# Patient Record
Sex: Female | Born: 1975
Health system: Southern US, Community
[De-identification: ages and names within clinical notes are randomized; demographics above are authoritative.]

## PROBLEM LIST (undated history)

## (undated) DIAGNOSIS — R0989 Other specified symptoms and signs involving the circulatory and respiratory systems: Secondary | ICD-10-CM

## (undated) DIAGNOSIS — Z8619 Personal history of other infectious and parasitic diseases: Secondary | ICD-10-CM

## (undated) DIAGNOSIS — R011 Cardiac murmur, unspecified: Secondary | ICD-10-CM

## (undated) DIAGNOSIS — IMO0001 Reserved for inherently not codable concepts without codable children: Secondary | ICD-10-CM

## (undated) DIAGNOSIS — D239 Other benign neoplasm of skin, unspecified: Secondary | ICD-10-CM

## (undated) DIAGNOSIS — Q251 Coarctation of aorta: Secondary | ICD-10-CM

## (undated) DIAGNOSIS — E785 Hyperlipidemia, unspecified: Secondary | ICD-10-CM

## (undated) HISTORY — DX: Coarctation of aorta: Q25.1

## (undated) HISTORY — DX: Hyperlipidemia, unspecified: E78.5

## (undated) HISTORY — DX: Cardiac murmur, unspecified: R01.1

## (undated) HISTORY — DX: Personal history of other infectious and parasitic diseases: Z86.19

## (undated) HISTORY — DX: Other benign neoplasm of skin, unspecified: D23.9

## (undated) HISTORY — DX: Reserved for inherently not codable concepts without codable children: IMO0001

## (undated) HISTORY — DX: Other specified symptoms and signs involving the circulatory and respiratory systems: R09.89

---
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
## 1997-03-07 HISTORY — PX: BREAST SURGERY: SHX581

## 1999-04-12 ENCOUNTER — Ambulatory Visit (HOSPITAL_BASED_OUTPATIENT_CLINIC_OR_DEPARTMENT_OTHER): Admission: RE | Admit: 1999-04-12 | Discharge: 1999-04-12 | Payer: Self-pay | Admitting: General Surgery

## 1999-04-12 ENCOUNTER — Encounter (INDEPENDENT_AMBULATORY_CARE_PROVIDER_SITE_OTHER): Payer: Self-pay | Admitting: Specialist

## 2000-06-27 ENCOUNTER — Other Ambulatory Visit: Admission: RE | Admit: 2000-06-27 | Discharge: 2000-06-27 | Payer: Self-pay | Admitting: Family Medicine

## 2001-07-05 ENCOUNTER — Other Ambulatory Visit: Admission: RE | Admit: 2001-07-05 | Discharge: 2001-07-05 | Payer: Self-pay | Admitting: Family Medicine

## 2001-07-10 ENCOUNTER — Encounter: Payer: Self-pay | Admitting: Internal Medicine

## 2002-08-19 ENCOUNTER — Other Ambulatory Visit: Admission: RE | Admit: 2002-08-19 | Discharge: 2002-08-19 | Payer: Self-pay | Admitting: Family Medicine

## 2003-08-22 ENCOUNTER — Other Ambulatory Visit: Admission: RE | Admit: 2003-08-22 | Discharge: 2003-08-22 | Payer: Self-pay | Admitting: Family Medicine

## 2004-01-15 ENCOUNTER — Ambulatory Visit: Payer: Self-pay | Admitting: Family Medicine

## 2004-03-07 HISTORY — PX: DILATION AND CURETTAGE OF UTERUS: SHX78

## 2004-08-18 ENCOUNTER — Other Ambulatory Visit: Admission: RE | Admit: 2004-08-18 | Discharge: 2004-08-18 | Payer: Self-pay | Admitting: Obstetrics and Gynecology

## 2004-08-23 ENCOUNTER — Ambulatory Visit: Payer: Self-pay | Admitting: Internal Medicine

## 2004-11-09 ENCOUNTER — Ambulatory Visit (HOSPITAL_COMMUNITY): Admission: RE | Admit: 2004-11-09 | Discharge: 2004-11-09 | Payer: Self-pay | Admitting: Obstetrics and Gynecology

## 2004-11-09 ENCOUNTER — Encounter (INDEPENDENT_AMBULATORY_CARE_PROVIDER_SITE_OTHER): Payer: Self-pay | Admitting: *Deleted

## 2004-12-09 ENCOUNTER — Ambulatory Visit: Payer: Self-pay | Admitting: Family Medicine

## 2005-01-31 ENCOUNTER — Ambulatory Visit: Payer: Self-pay | Admitting: Internal Medicine

## 2005-01-31 ENCOUNTER — Encounter: Admission: RE | Admit: 2005-01-31 | Discharge: 2005-01-31 | Payer: Self-pay | Admitting: Internal Medicine

## 2005-03-07 HISTORY — PX: COLON SURGERY: SHX602

## 2005-09-16 ENCOUNTER — Encounter: Payer: Self-pay | Admitting: Internal Medicine

## 2005-09-16 ENCOUNTER — Ambulatory Visit: Payer: Self-pay | Admitting: Internal Medicine

## 2005-09-16 ENCOUNTER — Ambulatory Visit (HOSPITAL_COMMUNITY): Admission: RE | Admit: 2005-09-16 | Discharge: 2005-09-16 | Payer: Self-pay | Admitting: Obstetrics and Gynecology

## 2005-10-14 ENCOUNTER — Encounter (INDEPENDENT_AMBULATORY_CARE_PROVIDER_SITE_OTHER): Payer: Self-pay | Admitting: Specialist

## 2005-10-14 ENCOUNTER — Inpatient Hospital Stay (HOSPITAL_COMMUNITY): Admission: EM | Admit: 2005-10-14 | Discharge: 2005-10-19 | Payer: Self-pay | Admitting: Surgery

## 2007-02-01 ENCOUNTER — Inpatient Hospital Stay (HOSPITAL_COMMUNITY): Admission: AD | Admit: 2007-02-01 | Discharge: 2007-02-04 | Payer: Self-pay | Admitting: Obstetrics & Gynecology

## 2008-01-03 ENCOUNTER — Ambulatory Visit: Payer: Self-pay | Admitting: Family Medicine

## 2008-02-05 ENCOUNTER — Ambulatory Visit: Payer: Self-pay | Admitting: Internal Medicine

## 2008-02-05 DIAGNOSIS — Q251 Coarctation of aorta: Secondary | ICD-10-CM

## 2008-02-05 DIAGNOSIS — R011 Cardiac murmur, unspecified: Secondary | ICD-10-CM

## 2008-02-05 DIAGNOSIS — J309 Allergic rhinitis, unspecified: Secondary | ICD-10-CM | POA: Insufficient documentation

## 2008-02-05 DIAGNOSIS — R030 Elevated blood-pressure reading, without diagnosis of hypertension: Secondary | ICD-10-CM

## 2008-02-05 LAB — CONVERTED CEMR LAB
Bilirubin Urine: NEGATIVE
Blood in Urine, dipstick: NEGATIVE
Glucose, Urine, Semiquant: NEGATIVE
Ketones, urine, test strip: NEGATIVE
Nitrite: NEGATIVE
Protein, U semiquant: NEGATIVE
Specific Gravity, Urine: <1.005
Urobilinogen, UA: 0.2
pH: 5

## 2008-02-06 ENCOUNTER — Encounter (INDEPENDENT_AMBULATORY_CARE_PROVIDER_SITE_OTHER): Payer: Self-pay | Admitting: *Deleted

## 2008-02-06 ENCOUNTER — Encounter: Payer: Self-pay | Admitting: Internal Medicine

## 2008-02-06 LAB — CONVERTED CEMR LAB: RBC / HPF: NONE SEEN (ref <3)

## 2008-02-08 LAB — CONVERTED CEMR LAB
ALT: 21 units/L (ref 0–35)
AST: 22 units/L (ref 0–37)
Albumin: 4 g/dL (ref 3.5–5.2)
Alkaline Phosphatase: 35 units/L — ABNORMAL LOW (ref 39–117)
BUN: 7 mg/dL (ref 6–23)
Basophils Absolute: 0 10*3/uL (ref 0.0–0.1)
Basophils Relative: 0.3 % (ref 0.0–3.0)
Bilirubin, Direct: 0.1 mg/dL (ref 0.0–0.3)
CO2: 27 meq/L (ref 19–32)
Calcium: 9.2 mg/dL (ref 8.4–10.5)
Chloride: 105 meq/L (ref 96–112)
Cholesterol: 178 mg/dL (ref 0–200)
Creatinine, Ser: 0.9 mg/dL (ref 0.4–1.2)
Eosinophils Absolute: 0.3 10*3/uL (ref 0.0–0.7)
Eosinophils Relative: 4.7 % (ref 0.0–5.0)
GFR calc Af Amer: 92 mL/min
GFR calc non Af Amer: 76 mL/min
Glucose, Bld: 80 mg/dL (ref 70–99)
HCT: 39.8 % (ref 36.0–46.0)
HDL: 64.2 mg/dL (ref >39.0)
Hemoglobin: 13.7 g/dL (ref 12.0–15.0)
LDL Cholesterol: 98 mg/dL (ref 0–99)
Lymphocytes Relative: 26.4 % (ref 12.0–46.0)
MCHC: 34.4 g/dL (ref 30.0–36.0)
MCV: 84.9 fL (ref 78.0–100.0)
Monocytes Absolute: 0.3 10*3/uL (ref 0.1–1.0)
Monocytes Relative: 5 % (ref 3.0–12.0)
Neutro Abs: 3.9 10*3/uL (ref 1.4–7.7)
Neutrophils Relative %: 63.6 % (ref 43.0–77.0)
Platelets: 211 10*3/uL (ref 150–400)
Potassium: 4.4 meq/L (ref 3.5–5.1)
RBC: 4.69 M/uL (ref 3.87–5.11)
RDW: 13.3 % (ref 11.5–14.6)
Sodium: 138 meq/L (ref 135–145)
TSH: 2 microintl units/mL (ref 0.35–5.50)
Total Bilirubin: 0.8 mg/dL (ref 0.3–1.2)
Total CHOL/HDL Ratio: 2.8
Total Protein: 7 g/dL (ref 6.0–8.3)
Triglycerides: 78 mg/dL (ref 0–149)
VLDL: 16 mg/dL (ref 0–40)
WBC: 6.1 10*3/uL (ref 4.5–10.5)

## 2008-02-15 ENCOUNTER — Ambulatory Visit: Payer: Self-pay

## 2008-02-15 ENCOUNTER — Encounter: Payer: Self-pay | Admitting: Internal Medicine

## 2008-02-22 ENCOUNTER — Telehealth (INDEPENDENT_AMBULATORY_CARE_PROVIDER_SITE_OTHER): Payer: Self-pay | Admitting: *Deleted

## 2008-06-19 ENCOUNTER — Ambulatory Visit: Payer: Self-pay | Admitting: Internal Medicine

## 2008-08-25 ENCOUNTER — Ambulatory Visit: Payer: Self-pay | Admitting: Internal Medicine

## 2008-08-25 LAB — CONVERTED CEMR LAB: Rapid Strep: NEGATIVE

## 2008-09-30 ENCOUNTER — Telehealth: Payer: Self-pay | Admitting: Internal Medicine

## 2009-09-25 ENCOUNTER — Ambulatory Visit: Payer: Self-pay | Admitting: Family Medicine

## 2009-09-28 ENCOUNTER — Ambulatory Visit: Payer: Self-pay | Admitting: Family Medicine

## 2009-12-07 ENCOUNTER — Inpatient Hospital Stay (HOSPITAL_COMMUNITY): Admission: RE | Admit: 2009-12-07 | Discharge: 2009-12-09 | Payer: Self-pay | Admitting: Obstetrics and Gynecology

## 2009-12-09 ENCOUNTER — Encounter
Admission: RE | Admit: 2009-12-09 | Discharge: 2010-01-08 | Payer: Self-pay | Source: Home / Self Care | Admitting: Obstetrics and Gynecology

## 2010-01-05 ENCOUNTER — Encounter: Payer: Self-pay | Admitting: Internal Medicine

## 2010-01-05 LAB — CONVERTED CEMR LAB: Pap Smear: NORMAL

## 2010-01-09 ENCOUNTER — Encounter
Admission: RE | Admit: 2010-01-09 | Discharge: 2010-02-08 | Payer: Self-pay | Source: Home / Self Care | Admitting: Obstetrics and Gynecology

## 2010-02-09 ENCOUNTER — Encounter
Admission: RE | Admit: 2010-02-09 | Discharge: 2010-03-04 | Payer: Self-pay | Source: Home / Self Care | Attending: Obstetrics and Gynecology | Admitting: Obstetrics and Gynecology

## 2010-03-28 ENCOUNTER — Encounter: Payer: Self-pay | Admitting: Obstetrics and Gynecology

## 2010-04-06 NOTE — Assessment & Plan Note (Signed)
Summary: insect bite?/cbs   Vital Signs:  Patient profile:   35 year old female Height:      64.5 inches Weight:      182 pounds BMI:     30.87 Temp:     98.4 degrees F oral Pulse rate:   72 / minute BP sitting:   110 / 70  (left arm)  Vitals Entered By: Jeremy Johann CMA (September 25, 2009 10:52 AM) CC: insect bite on upper left back, redness and pain x1day   History of Present Illness: Pt here c/o rash back.  ? insect bite.  Rash hurts more than it itches.   RAsh x2 days .  Current Medications (verified): 1)  Valtrex 1 Gm Tabs (Valacyclovir Hcl) .Marland Kitchen.. 1 By Mouth Three Times A Day  Allergies (verified): No Known Drug Allergies  Past History:  Past medical, surgical, family and social histories (including risk factors) reviewed for relevance to current acute and chronic problems.  Past Medical History: Reviewed history from 02/05/2008 and no changes required. G3 P1 (1 life birth) Allergic rhinitis has seen ENT and allergy before  Heart murmur  ECHO 2003 and  2007 (reports scanned) aorta coarctation, s/p correction  1983  Past Surgical History: Reviewed history from 02/05/2008 and no changes required. aorta coarctation, s/p correction  1983 breast bx 1999 D&C 2006 D&C w/ colon repair 2007 Csection 2008  Family History: Reviewed history from 02/05/2008 and no changes required. HTN - M DM - no CAD - no stroke - no hyperlipidemia - M colon Ca - no breast Ca - no ovarian/uterine Ca - no melanoma - F (w/ late liver mets)  Social History: Reviewed history from 01/03/2008 and no changes required. Married, 1 child (2008)  Review of Systems      See HPI  Physical Exam  General:  Well-developed,well-nourished,in no acute distress; alert,appropriate and cooperative throughout examination Skin:  + vesicles in clusters R side x2  Psych:  Oriented X3 and normally interactive.     Impression & Recommendations:  Problem # 1:  SHINGLES (ICD-053.9) valtrex for 10  days notify OB call if you need any pain meds  Complete Medication List: 1)  Valtrex 1 Gm Tabs (Valacyclovir hcl) .Marland Kitchen.. 1 by mouth three times a day Prescriptions: VALTREX 1 GM TABS (VALACYCLOVIR HCL) 1 by mouth three times a day  #30 x 0   Entered and Authorized by:   Loreen Freud DO   Signed by:   Loreen Freud DO on 09/25/2009   Method used:   Electronically to        [id number] (retail)       [address]       [place], [place]  [address]       Ph: [phone] or [phone]       Fax: [phone]   RxID:   [id number]

## 2010-04-06 NOTE — Assessment & Plan Note (Signed)
Summary: wants blisters to be checked/diagnosed with shingles on frida...   Vital Signs:  Patient profile:   35 year old female Weight:      182 pounds Pulse rate:   105 / minute BP sitting:   150 / 80  (left arm)  Vitals Entered By: Doristine Devoid CMA (September 28, 2009 9:02 AM) CC: dx w/ shingles would like for them to be checked to see if they are healing    History of Present Illness: 35 yo woman here today for shingles recheck.  in tears b/c she's not sure if she's contagious- employer is concerned.  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  Dr Vincente Poli is OB.  BP is elevated today.  Current Medications (verified): 1)  Valtrex 1 Gm Tabs (Valacyclovir Hcl) .Marland Kitchen.. 1 By Mouth Three Times A Day  Allergies (verified): No Known Drug Allergies  Review of Systems      See HPI  Physical Exam  General:  tearful, obviously stressed Skin:  + vesicles in clusters R side x2    Impression & Recommendations:  Problem # 1:  SHINGLES (ICD-053.9) Assessment Unchanged unchanged.  no signs of infxn.  reviewed that as long as lesions are covered that she is not contagious.  given that lesions will flare in times of stress will write pt out of work for 3 days.  this will also give her time to see her OB given that her BP is elevated.  asymptomatic- pt reports it's stress related.  BP declined to 134/80 by end of appt.  still encouraged her to call OB.  Problem # 2:  ELEVATED BLOOD PRESSURE WITHOUT DIAGNOSIS OF HYPERTENSION (ICD-796.2) Assessment: Unchanged see above.  Complete Medication List: 1)  Valtrex 1 Gm Tabs (Valacyclovir hcl) .Marland Kitchen.. 1 by mouth three times a day  Patient Instructions: 1)  Follow up as needed 2)  Please call Dr Vincente Poli and make an appt about your blood pressure 3)  Finish the Valrex 4)  As long as the lesions are covered you are not contagious 5)  Hang in there!!!

## 2010-05-19 ENCOUNTER — Encounter: Payer: Self-pay | Admitting: Internal Medicine

## 2010-05-19 ENCOUNTER — Other Ambulatory Visit: Payer: Self-pay | Admitting: Internal Medicine

## 2010-05-19 ENCOUNTER — Encounter (INDEPENDENT_AMBULATORY_CARE_PROVIDER_SITE_OTHER): Payer: 59 | Admitting: Internal Medicine

## 2010-05-19 DIAGNOSIS — Z0000 Encounter for general adult medical examination without abnormal findings: Secondary | ICD-10-CM

## 2010-05-19 DIAGNOSIS — E785 Hyperlipidemia, unspecified: Secondary | ICD-10-CM

## 2010-05-19 LAB — BASIC METABOLIC PANEL
BUN: 14 mg/dL (ref 6–23)
CO2: 27 mEq/L (ref 19–32)
Calcium: 9.4 mg/dL (ref 8.4–10.5)
Chloride: 102 mEq/L (ref 96–112)
Creatinine, Ser: 0.8 mg/dL (ref 0.4–1.2)
GFR: 89.98 mL/min (ref >60.00)
Glucose, Bld: 71 mg/dL (ref 70–99)
Potassium: 4.3 mEq/L (ref 3.5–5.1)
Sodium: 138 mEq/L (ref 135–145)

## 2010-05-19 LAB — LIPID PANEL
Cholesterol: 215 mg/dL — ABNORMAL HIGH (ref 0–200)
HDL: 55.5 mg/dL (ref >39.00)
Total CHOL/HDL Ratio: 4
Triglycerides: 55 mg/dL (ref 0.0–149.0)
VLDL: 11 mg/dL (ref 0.0–40.0)

## 2010-05-19 LAB — CBC WITH DIFFERENTIAL/PLATELET
Basophils Absolute: 0 10*3/uL (ref 0.0–0.1)
Basophils Relative: 0.7 % (ref 0.0–3.0)
Eosinophils Absolute: 0.2 10*3/uL (ref 0.0–0.7)
Eosinophils Relative: 3.7 % (ref 0.0–5.0)
HCT: 43.1 % (ref 36.0–46.0)
Hemoglobin: 14.6 g/dL (ref 12.0–15.0)
Lymphocytes Relative: 29.5 % (ref 12.0–46.0)
Lymphs Abs: 1.7 10*3/uL (ref 0.7–4.0)
MCHC: 33.8 g/dL (ref 30.0–36.0)
MCV: 82.6 fl (ref 78.0–100.0)
Monocytes Absolute: 0.3 10*3/uL (ref 0.1–1.0)
Monocytes Relative: 4.7 % (ref 3.0–12.0)
Neutro Abs: 3.4 10*3/uL (ref 1.4–7.7)
Neutrophils Relative %: 61.4 % (ref 43.0–77.0)
Platelets: 294 10*3/uL (ref 150.0–400.0)
RBC: 5.22 Mil/uL — ABNORMAL HIGH (ref 3.87–5.11)
RDW: 14.7 % — ABNORMAL HIGH (ref 11.5–14.6)
WBC: 5.6 10*3/uL (ref 4.5–10.5)

## 2010-05-19 LAB — CBC
HCT: 27 % — ABNORMAL LOW (ref 36.0–46.0)
Hemoglobin: 9.1 g/dL — ABNORMAL LOW (ref 12.0–15.0)
MCH: 27 pg (ref 26.0–34.0)
MCHC: 33.8 g/dL (ref 30.0–36.0)
MCV: 79.8 fL (ref 78.0–100.0)
Platelets: 218 10*3/uL (ref 150–400)
RBC: 3.39 MIL/uL — ABNORMAL LOW (ref 3.87–5.11)
RDW: 14.3 % (ref 11.5–15.5)
WBC: 9.4 10*3/uL (ref 4.0–10.5)

## 2010-05-19 LAB — TSH: TSH: 1.81 u[IU]/mL (ref 0.35–5.50)

## 2010-05-19 LAB — LDL CHOLESTEROL, DIRECT: Direct LDL: 139.8 mg/dL

## 2010-05-19 LAB — AST: AST: 18 U/L (ref 0–37)

## 2010-05-19 LAB — ALT: ALT: 18 U/L (ref 0–35)

## 2010-05-20 LAB — CBC
HCT: 32.6 % — ABNORMAL LOW (ref 36.0–46.0)
Hemoglobin: 11.1 g/dL — ABNORMAL LOW (ref 12.0–15.0)
MCH: 27.3 pg (ref 26.0–34.0)
MCHC: 34 g/dL (ref 30.0–36.0)
MCV: 80.4 fL (ref 78.0–100.0)
Platelets: 276 10*3/uL (ref 150–400)
RBC: 4.05 MIL/uL (ref 3.87–5.11)
RDW: 14.2 % (ref 11.5–15.5)
WBC: 8.1 10*3/uL (ref 4.0–10.5)

## 2010-05-20 LAB — SURGICAL PCR SCREEN
MRSA, PCR: NEGATIVE
Staphylococcus aureus: NEGATIVE

## 2010-05-20 LAB — RPR: RPR Ser Ql: NONREACTIVE

## 2010-05-25 NOTE — Assessment & Plan Note (Signed)
Summary: physical--[id number]//ph   Vital Signs:  Patient profile:   35 year old female Height:      64.5 inches Weight:      136.13 pounds BMI:     23.09 Pulse rate:   61 / minute Pulse rhythm:   regular BP sitting:   116 / 74  (left arm) Cuff size:   regular  Vitals Entered By: Army Fossa CMA (May 19, 2010 8:23 AM) CC: CPX, fasting Comments no complaints    History of Present Illness: CPX had a baby 5 months ago, not brest feeding  ambulatory BPs WNL , occasionally feels anxious and wonders how BP is   Current Medications (verified): 1)  None  Allergies (verified): No Known Drug Allergies  Past History:  Past Medical History: G2 P2   Allergic rhinitis has seen ENT and allergy before  Heart murmur  ECHO 2003 and  2007 (reports scanned) aorta coarctation, s/p correction  1983 palpable Ao (abdmen) normal Ao, Renal U/S 12-09 mild shingels during pregnancy 2011  Past Surgical History: aorta coarctation, s/p correction  1983 breast bx 1999 D&C 2006 D&C w/ colon repair 2007 Csection 2008 Csection 12-2009  Family History: HTN - M hypothyroid-- M DM - no CAD - no stroke - no hyperlipidemia - M colon Ca - no breast Ca - no ovarian/uterine Ca - no melanoma - F (w/ late liver mets)  Social History: BC-- husband vasectomy  Married, 2  child (2008, 2011) tobacco--no ETOH-- socially  FT job, 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 CV:  Denies chest pain or discomfort and swelling of feet. Resp:  Denies cough and shortness of breath. GI:  Denies bloody stools, nausea, and vomiting. Psych:  Denies anxiety and depression.  Physical Exam  General:  alert, well-developed, and well-nourished.   Neck:   no thyromegaly Lungs:  normal respiratory effort, no intercostal retractions, no accessory muscle use, and normal breath sounds.   Heart:  (+) syst. murmur III/VI, increased  S2? normal rate  Abdomen:  soft, non-tender, no distention, no guarding, and no rigidity.   palpable aorta, nontender , at the upper abdomen Extremities:   no lower extremity edema Psych:  Cognition and judgment appear intact. Alert and cooperative with normal attention span and concentration.      Impression & Recommendations:  Problem # 1:  HEALTH MAINTENANCE EXAM (ICD-V70.0) Td-03  sees gyn never had a Cscope   encouraged to continue with her her  healthy lifestyle Orders: Venipuncture (16109) TLB-BMP (Basic Metabolic Panel-BMET) (80048-METABOL) TLB-CBC Platelet - w/Differential (85025-CBCD) TLB-Lipid Panel (80061-LIPID) TLB-TSH (Thyroid Stimulating Hormone) (84443-TSH) TLB-ALT (SGPT) (84460-ALT) TLB-AST (SGOT) (84450-SGOT) Specimen Handling (60454)  Problem # 2:  SYSTOLIC MURMUR (UJW-119.2)   history of systolic murmur, history of Ao coarctation    would like to get established with a cardiologist. Will refer  Orders: Cardiology Referral (Cardiology)  Problem # 3:  ELEVATED BLOOD PRESSURE WITHOUT DIAGNOSIS OF HYPERTENSION (ICD-796.2)  good ambulatory BPs  encouraged to get her own cuff  to keep a closer eye on her BP  BP today: 116/74 Prior BP: 150/80 (09/28/2009)  Labs Reviewed: Creat: 0.9 (02/05/2008) Chol: 178 (02/05/2008)   HDL: 64.2 (02/05/2008)   LDL: 98 (02/05/2008)   TG: 78 (02/05/2008)     Patient Instructions: 1)  Please schedule a follow-up appointment in 1 year.    Orders Added: 1)  Venipuncture [36415] 2)  TLB-BMP (Basic Metabolic Panel-BMET) [  80048-METABOL] 3)  TLB-CBC Platelet - w/Differential [85025-CBCD] 4)  TLB-Lipid Panel [80061-LIPID] 5)  TLB-TSH (Thyroid Stimulating Hormone) [84443-TSH] 6)  TLB-ALT (SGPT) [84460-ALT] 7)  TLB-AST (SGOT) [84450-SGOT] 8)  Specimen Handling [99000] 9)  Est. Patient age 39-39 [phone] 6)  Cardiology Referral [Cardiology]     Preventive Care Screening  Pap Smear:    Date:  01/05/2010    Results:   normal

## 2010-06-25 ENCOUNTER — Encounter: Payer: Self-pay | Admitting: Cardiology

## 2010-06-25 ENCOUNTER — Encounter: Payer: Self-pay | Admitting: *Deleted

## 2010-06-28 ENCOUNTER — Encounter: Payer: Self-pay | Admitting: Cardiology

## 2010-06-28 ENCOUNTER — Ambulatory Visit (INDEPENDENT_AMBULATORY_CARE_PROVIDER_SITE_OTHER): Payer: 59 | Admitting: Cardiology

## 2010-06-28 DIAGNOSIS — R011 Cardiac murmur, unspecified: Secondary | ICD-10-CM

## 2010-06-28 DIAGNOSIS — R030 Elevated blood-pressure reading, without diagnosis of hypertension: Secondary | ICD-10-CM

## 2010-06-28 DIAGNOSIS — Q251 Coarctation of aorta: Secondary | ICD-10-CM

## 2010-06-28 NOTE — Assessment & Plan Note (Signed)
Echocardiogram to exclude bicuspid valve.

## 2010-06-28 NOTE — Progress Notes (Signed)
HPI: 35 year old female who evaluation of coarctation of the aorta. Abdominal ultrasound in December of 2009 showed a normal abdominal aorta, normal renal arteries. Echocardiogram in 2007 showed normal LV function and peak velocity in descending aorta of 2.3 m/s. Patient is status post repair of coarctation in 36. She denies dyspnea on exertion, orthopnea, PND, pedal edema, palpitations, syncope or chest pain.  No current outpatient prescriptions on file.    No Known Allergies  Past Medical History  Diagnosis Date  . Allergic rhinitis   . History of shingles     mild during pregancy 2011  . Aorta coarctation     s/p correction 1983  . Hyperlipidemia     Past Surgical History  Procedure Date  . Breast surgery     Benign nodule removed  . Colon surgery   . Cesarean section   . Coarctation of aorta repair     History   Social History  . Marital Status: Married    Spouse Name: N/A    Number of Children: 2  . Years of Education: N/A   Occupational History  . [employer]    Social History Main Topics  . Smoking status: Never Smoker   . Smokeless tobacco: Not on file  . Alcohol Use: Yes     Rare  . Drug Use: No  . Sexually Active: Not on file   Other Topics Concern  . Not on file   Social History Narrative  . No narrative on file    Family History  Problem Relation Age of Onset  . Hypertension    . Hyperlipidemia      ROS: no fevers or chills, productive cough, hemoptysis, dysphasia, odynophagia, melena, hematochezia, dysuria, hematuria, rash, seizure activity, orthopnea, PND, pedal edema, claudication. Remaining systems are negative.  Physical Exam: Right arm 142/80. Left arm 132/74. General:  Well developed/well nourished in NAD Skin warm/dry Patient not depressed No peripheral clubbing Back-normal HEENT-normal/normal eyelids Neck supple/normal carotid upstroke bilaterally; no bruits; no JVD; no thyromegaly chest - CTA/ normal expansion CV -  RRR/normal S1 and S2; no rubs or gallops;  PMI nondisplaced; 2/6 systolic murmur upper left sternal border. Abdomen -NT/ND, no HSM, no mass, + bowel sounds, no bruit 2+ femoral pulses, no bruits Ext-no edema, chords, 2+ DP Neuro-grossly nonfocal  ECG Sinus rhythm at a rate of 67. Cannot rule out prior septal infarct. RV conduction delay.

## 2010-06-28 NOTE — Assessment & Plan Note (Signed)
Patient is status post repair of coarctation. I will schedule a CTA of her thoracic aorta to assess. Schedule echocardiogram to exclude bicuspid aortic valve.

## 2010-06-28 NOTE — Patient Instructions (Signed)
Your physician has requested that you have an echocardiogram. Echocardiography is a painless test that uses sound waves to create images of your heart. It provides your doctor with information about the size and shape of your heart and how well your heart's chambers and valves are working. This procedure takes approximately one hour. There are no restrictions for this procedure.   Non-Cardiac CT Angiography (CTA), is a special type of CT scan that uses a computer to produce multi-dimensional views of major blood vessels throughout the body. In CT angiography, a contrast material is injected through an IV to help visualize the blood vessels   Your physician recommends that you schedule a follow-up appointment in: ONE YEAR

## 2010-06-28 NOTE — Assessment & Plan Note (Signed)
Watch blood pressure and treat as needed.

## 2010-07-14 ENCOUNTER — Ambulatory Visit (HOSPITAL_COMMUNITY): Payer: 59 | Attending: Cardiology | Admitting: Radiology

## 2010-07-14 ENCOUNTER — Ambulatory Visit (INDEPENDENT_AMBULATORY_CARE_PROVIDER_SITE_OTHER)
Admission: RE | Admit: 2010-07-14 | Discharge: 2010-07-14 | Disposition: A | Discharge status: Home / Self Care | Payer: 59 | Source: Ambulatory Visit | Attending: Cardiology | Admitting: Cardiology

## 2010-07-14 DIAGNOSIS — R011 Cardiac murmur, unspecified: Secondary | ICD-10-CM | POA: Insufficient documentation

## 2010-07-14 DIAGNOSIS — Q251 Coarctation of aorta: Secondary | ICD-10-CM

## 2010-07-14 MED ORDER — IOHEXOL 300 MG/ML  SOLN
100.0000 mL | Freq: Once | INTRAMUSCULAR | Status: AC | PRN
  Administered 2010-07-14: 100 mL via INTRAVENOUS

## 2010-07-20 NOTE — Discharge Summary (Signed)
NAME:  [name], [name]                   ACCOUNT NO.:  [id number]   MEDICAL RECORD NO.:  [id number]          PATIENT TYPE:  INP   LOCATION:  9148                          FACILITY:  [facility]   PHYSICIAN:  Duke Salvia. Marcelle Overlie, M.D.DATE OF BIRTH:  [birth year]   DATE OF ADMISSION:  02/01/2007  DATE OF DISCHARGE:  02/04/2007                               DISCHARGE SUMMARY   ADMITTING DIAGNOSES:  1. Intrauterine pregnancy at 45 weeks estimated gestational age.  2. Spontaneous rupture of membranes.  3. History of uterine perforation after DandE for trisomy 18.   DISCHARGE DIAGNOSES:  1. Status post low transverse cesarean section.  2. Viable female infant.   REASON FOR ADMISSION:  Please see written H&P.   HOSPITAL COURSE:  The patient is 35 year old white married female  gravida 2, para 0 that was admitted to [facility] at 36  weeks estimated gestational age with spontaneous rupture of membranes.  The patient's history was significant for history of a uterine  perforation after DNA for trisomy 18.  The uterine perforation had also  resulted in a partial sigmoid colon resection.  The patient had been  scheduled for cesarean delivery.  Due to spontaneous rupture of  membranes, the decision was made to proceed with a primary low  transverse cesarean section.  The patient was then transferred to the  operating room where spinal anesthesia was administered without  difficulty.  A low transverse incision was made with delivery of a  viable female infant weighing 6 pounds 7 ounces with Apgars of 9 at 1  minute and 9 at 5 minutes.  The patient tolerated procedure well and  taken to the recovery room in stable condition.  On postoperative day  #1, the patient was without complaint.  Vital signs were stable.  She  was afebrile.  Abdomen was soft with good return of bowel function.  Fundus is firm and nontender.  Incision was clean, dry and intact.  On  postoperative day #2 the patient was  without complaint.  Vital signs  remained stable.  She was afebrile.  She was ambulating well.  Incision  continued to be clean, dry and intact.  On postoperative day #3 the  patient was without complaint.  Vital signs were stable.  She was  afebrile.  Fundus firm and nontender.  Incision was clean, dry and  intact.  Staples removed and the patient was later discharged home.   CONDITION ON DISCHARGE:  Stable.   DISCHARGE INSTRUCTIONS:  1. Diet regular as tolerated.  2. Activity no heavy lifting, no driving x2 weeks, no vaginal entry.   FOLLOW UP:  Patient follow up in the office in 1-2 weeks for incision  check.  She is to call for temperature greater than 100 degrees,  persistent nausea, vomiting, heavy vaginal bleeding and/or redness or  drainage from incisional site.   DISCHARGE MEDICATIONS:  1. Tylox #30 one p.o. q.4-6 p.r.n.  2. Motrin 600 mg q.6 h.  3. Prenatal vitamins one p.o. daily.  4. Colace one p.o. daily p.r.n.      Okey Regal  Lyda Jester, N.P.      Richard M. Marcelle Overlie, M.D.  Electronically Signed    CC/MEDQ  D:  02/22/2007  T:  02/22/2007  Job:  [id number]

## 2010-07-20 NOTE — Op Note (Signed)
NAME:  [name], [name]                   ACCOUNT NO.:  [id number]   MEDICAL RECORD NO.:  [id number]          PATIENT TYPE:  INP   LOCATION:  9148                          FACILITY:  [facility]   PHYSICIAN:  Michelle L. Grewal, M.D.DATE OF BIRTH:  10-05-1975   DATE OF PROCEDURE:  02/01/2007  DATE OF DISCHARGE:                               OPERATIVE REPORT   PREOPERATIVE DIAGNOSES:  1. Intrauterine pregnancy at 36 weeks.  2. Spontaneous rupture of membranes.  3. Labor.  4. History of uterine perforation after dilatation and evacuation for      Trisomy 18   POSTOPERATIVE DIAGNOSES:  1. Intrauterine pregnancy at 36 weeks.  2. Spontaneous rupture of membranes.  3. Labor.  4. History of uterine perforation after dilatation and evacuation for      Trisomy 18   PROCEDURE:  Primary low transverse cesarean section.   SURGEON:  Michelle L. Vincente Poli, M.D.   ASSISTANTFreddy Finner, M.D.   ANESTHESIA:  Spinal.   SPECIMENS:  Female infant, Apgars 9 at 1 minute and 10 at 5 minutes.   ESTIMATED BLOOD LOSS:  500 mL.   COMPLICATIONS:  None.   DRAINS:  Foley.   DESCRIPTION OF PROCEDURE:  The patient was taken to the operating room.  She was given her spinal without incident after informed consent was  obtained.  She was then prepped and draped in the usual sterile fashion.   A low transverse incision was made in the skin and carried down to the  fascia.  The fascia was scored in the midline and extended laterally.  The rectus muscles were separated in the midline.  The peritoneum was  entered bluntly.  The peritoneal incision was then stretched.  The  bladder blade was inserted.  A low transverse incision was made in the  uterus.  The uterus was entered using a hemostat.  Amniotic fluid was  clear.  The baby was in cephalic presentation and was delivered easily  without any difficulty.  The baby was a female infant with Apgars of 9  at 1 minute and 10 at 5 minutes.  The cord was clamped and  cut.  The  baby was handed to the awaiting pediatricians.  The uterus was  exteriorized of blood.  The placenta was cleared of all clots and  debris.  The uterine incision was closed in one layer using 0 chromic in  a continuous running locked stitch.  There were some small adhesions in  the posterior wall of the uterus involving the sigmoid colon and uterus,  and I did take those adhesions down sharply without any difficulty.  It  was probably from her previous sigmoid colon resection.  The uterus was  returned to the abdomen.  Irrigation was performed.  Hemostasis was  excellent.  The peritoneum and rectus muscles were reapproximated using  0 Vicryl.  The fascia was closed using 0 Vicryl in a running stitch x2  starting at each corner and meeting in the midline.  After irrigation of  the subcutaneous layer, the skin was closed with staples.  All sponge,  lap, and instrument counts were correct x2.   The patient went to the recovery room in stable condition.      Michelle L. Vincente Poli, M.D.  Electronically Signed     MLG/MEDQ  D:  03/30/2007  T:  03/30/2007  Job:  [id number]

## 2010-07-23 NOTE — H&P (Signed)
NAMEAcquanetta, [name]                   ACCOUNT NO.:  [id number]   MEDICAL RECORD NO.:  [id number]          PATIENT TYPE:  INP   LOCATION:  1606                         FACILITY:  [facility]   PHYSICIAN:  Freddy Finner, M.D.   DATE OF BIRTH:  1975-09-12   DATE OF ADMISSION:  10/14/2005  DATE OF DISCHARGE:                                HISTORY & PHYSICAL   ADMITTING DIAGNOSES:  Status post sigmoid colon resection and reanastomosis  following perforation of uterus for second trimester pregnancy termination  with bowel injury.   The patient is a 35 year old white married female, gravida 2, para 0, who  had a diagnosis of trisomy 82 made with antenatal testing.  After careful  consultation with a regular physician, Dr. Vincente Poli, she has elected to  proceed with termination of her pregnancy by dilatation and evacuation of  her uterus.  This procedure was accomplished at the [facility] on the day of this admission, October 14, 2005.  The procedure was  complicated by uterine perforation with injury to sigmoid colon.  She  subsequently underwent a laparoscopy and exploratory laparotomy, during  which time the uterine perforation was repaired, and she had a resection of  approximately 3 inches of sigmoid colon with reanastomosis by Dr. Jamey Ripa.  She is admitted to this hospital for further postoperative management.   REVIEW OF SYSTEMS:  On admission was unremarkable.  No cardiopulmonary, GI  or GU problems unrelated to the surgical procedure which required her  admission.   PAST MEDICAL HISTORY:  Significant for coarctation of the aorta at birth for  which she had surgical correction in 1983.  She has no other known  significant medical illnesses.  She is currently on no medications on a  regular basis other than her prenatal vitamins.  Previous surgical  procedures included D&E for spontaneous abortion in September of 2006, and  her heart surgery; those are the only ones.  She does  not have known  allergies to medications.  She has never had a blood transfusion.  She does  not use cigarettes or alcohol.   FAMILY HISTORY:  Mother is known to have hypertension.  Father died with  liver cancer but had exposure to Agent Orange.  Mother is further known to  have thyroid dysfunction.  She has no other significant family history.   PHYSICAL EXAMINATION:  HEENT:  Grossly within normal limits.  Thyroid gland  is not palpably enlarged.  VITAL SIGNS:  Most recent blood pressure in the office was 124/70.  CHEST:  Clear to auscultation throughout.  BREASTS:  Exam is deferred, but was performed by Dr. Vincente Poli at her  admission.  I revisited it and it was normal.  HEART:  Normal sinus rhythm.  There is a loud grade 2-3 holosystolic murmur  at the second intercostal space on the left chest, consistent with her  previous heart history.  There are no other audible murmurs, rubs, or  gallops.  ABDOMEN:  Remarkable for the gravid state with uterine size consistent with  16 weeks.  PELVIC:  Exam  performed in the office on the day prior to procedure revealed  the uterus to be appropriate for dates.  The cervix was soft, consistent  with a pregnant state but closed.  A thick laminaria was placed at the time  of my examination and left in place until the surgical procedure noted  above.  EXTREMITIES:  Without cyanosis, clubbing, or edema.   ASSESSMENT:  Intrauterine pregnancy, 16 weeks' gestation, trisomy 32 genetic  defect, status post D&E for termination of pregnancy and exploratory  laparotomy for repair of uterine perforation and partial sigmoid resection  with reanastomosis of bowel.   PLAN:  Admission to this hospital for further management and followup by Dr.  Jamey Ripa and his associates, as well as our team following her bowel  resection.      Freddy Finner, M.D.  Electronically Signed     WRN/MEDQ  D:  10/15/2005  T:  10/15/2005  Job:  [id number]

## 2010-07-23 NOTE — Discharge Summary (Signed)
[name], [name]                   ACCOUNT NO.:  [id number]   MEDICAL RECORD NO.:  [id number]          PATIENT TYPE:  INP   LOCATION:  1606                         FACILITY:  [facility]   PHYSICIAN:  Currie Paris, M.D.DATE OF BIRTH:  08-Oct-1975   DATE OF ADMISSION:  10/14/2005  DATE OF DISCHARGE:  10/19/2005                                 DISCHARGE SUMMARY   FINAL DIAGNOSES:  1. 16 weeks intrauterine pregnancy, Trisomy 18 genetic defect.  2. Status post D&E for termination of pregnancy with exploratory      laparotomy for repair of uterine injury and partial sigmoid resection      with anastomosis of colon.  3. Acute blood loss anemia.   HISTORY OF PRESENT ILLNESS/HOSPITAL COURSE:  [name] is a 35 year old  lady who had a diagnosis of Trisomy 23. After consultations, she elected to  have termination of her pregnancy. This was done on October 14, 2005 by Dr.  Varney Baas. At surgery she had a perforation of the uterus with also injury  to the sigmoid colon that occurred at that time and was immediately  recognized. She underwent laparotomy with repair of her uterus by Dr. Jennette Kettle.  She also had resection and primary anastomosis of her sigmoid colon.  Postoperatively, she did well. Her hemoglobin drifted down and she was  transfused 1 unit of blood. She gradually developed bowel sounds, bowel  function and was able to be ambulating and tolerated a diet. By October 19, 2005, she felt able to go home. She had been ambulating, taking care of  herself with minimal pain and normal bowel function. No problems with fevers  and her wound healing nicely. The patient was discharged in satisfactory  condition to followup in my office as well as with Dr. Jennette Kettle, to continue her  Wellbutrin and iron.   LABORATORY DATA:  Studies this admission included an initial hemoglobin  immediately postoperative of 8.9, which drifted to 6.7 but postoperative at  9.5 by discharge. Electrolytes are basically  normal.   No pathology report is available in the chart at this time for dictation  into the summary.      Currie Paris, M.D.  Electronically Signed     CJS/MEDQ  D:  11/14/2005  T:  11/14/2005  Job:  [id number]   cc:   Freddy Finner, M.D.  Fax: [phone]

## 2010-07-23 NOTE — Op Note (Signed)
NAMEShavontae, [name]                   ACCOUNT NO.:  [id number]   MEDICAL RECORD NO.:  [id number]         PATIENT TYPE:  LINP   LOCATION:                               FACILITY:  [facility]   PHYSICIAN:  Currie Paris, M.D.DATE OF BIRTH:  Apr 04, 1975   DATE OF PROCEDURE:  10/14/2005  DATE OF DISCHARGE:                                 OPERATIVE REPORT   DIAGNOSIS:  Rectal sigmoid perforation.   CLINICAL HISTORY:  Mrs. Pickford is a 35 year old lady who was undergoing an  abortive procedure by Dr. Jennette Kettle during which the uterus was apparently  perforated and Dr. Jennette Kettle recognized that there was also a bowel injury.  He  asked [place] to come over largely to evaluate the situation.  On my arrival, the  patient's abdomen was opened.  Dr. Jennette Kettle had repaired the uterus.  There was  a segment of the rectosigmoid that had a deserosalized area but also a  perforation with basically no spill.  There were also some other areas of  deserosalization extending over a distance of about 15 cm.  I did not think  this was particularly reparable because of the length of the injury running  longitudinally and then we could not repair this without some significant  narrowing of the colon.   I, therefore, elected to a resection.  I freed up the proximal sigmoid using  a cautery.  I picked a point in the very distal sigmoid where we had clear  healthy tissue, made a window in the mesentery and divided the colon between  bowel clamps.  The mesocolon was then divided down towards the rectum until  I was beyond the area of injury which was really at the rectosigmoid  junction.  A right-angle clamp was placed across here and the bowel cut off  and the resection completed.   Because the abdomen had very little contamination and there was very little  stool present in the colon, I went ahead and elected to do a primary  anastomosis.  This was done in a single layer with 3-0 silk. I put in the  back row, then turned inthe  front row. I placed some additional Lembert  sutures to be sure we had complete closure.  Lumen was fairly large here so  we had a very good lumen and there was absolutely no tension on the  anastomosis.  The blood supply was quite good.   Once this was done, we changed gloves and instruments and irrigated and made  sure everything was completely dry.  I put Tisseel around the anastomosis to  see if that would also help protect it.  We also irrigated, washed the colon  out a little bit with some Betadine-soaked 4 x 4 to see if that would also  diminish the bacterial countrior to doing the  anastomosis.  I thought that since this was a healthy patient, we were safe  putting this colon back together at this point since we had basically very  healthy colon at each end.   The patient tolerated this procedure nicely.  Dr. Jennette Kettle then closed.      Currie Paris, M.D.  Electronically Signed     CJS/MEDQ  D:  10/14/2005  T:  10/14/2005  Job:  [id number]   cc:   Freddy Finner, M.D.  Fax: [phone]

## 2010-07-23 NOTE — Op Note (Signed)
NAME:  [name], Natasha                   ACCOUNT NO.:  [id number]   MEDICAL RECORD NO.:  [id number]          PATIENT TYPE:  AMB   LOCATION:  SDC                           FACILITY:  [facility]   PHYSICIAN:  Michelle L. Grewal, M.D.DATE OF BIRTH:  02/03/1976   DATE OF PROCEDURE:  11/09/2004  DATE OF DISCHARGE:                                 OPERATIVE REPORT   PREOP DIAGNOSIS:  Missed AB.   POSTOP DIAGNOSIS:  Missed AB.   PROCEDURE:  Dilatation and evacuation.   SURGEON:  Dr. Vincente Poli.   ANESTHESIA:  MAC with local.   SPECIMENS:  Products of conception.   ESTIMATED BLOOD LOSS:  Minimal.   COMPLICATIONS:  None.   PROCEDURE:  Patient taken to the operating room. She was given anesthesia.  She was prepped and draped in usual sterile fashion.  In and out catheter  was used to empty the bladder.  Speculum was inserted the vagina. The cervix  was grasped with tenaculum and a paracervical block was performed in  standard fashion. The uterus was sounded in the midline and cervix was noted  to be slightly open.  The cervical internal os was gently dilated using  Pratt dilators.  A #7 suction cannula was inserted and suction curettage was  performed x2 with retrieval of contents grossly consistent with products of  conception.  The suction curette was removed. A sharp curette was inserted  and the uterus was thoroughly curetted of all tissue.  At the end of the  procedure, all sponge, lap and instrument counts were correct x2. The  patient went to recovery room in stable condition.  Of note, she is Rh  positive.      Michelle L. Vincente Poli, M.D.  Electronically Signed     MLG/MEDQ  D:  11/09/2004  T:  11/09/2004  Job:  [id number]

## 2010-07-23 NOTE — Op Note (Signed)
. [facility]  Patient:    [name]                              MRN: [id number] Proc. Date: 04/12/99 Adm. Date:  [id number] Attending:  Tempie Donning CC:         Angelena Sole, M.D. [facility]                           Operative Report  REFERRING PHYSICIAN:  Angelena Sole, M.D.  OPERATIVE PROCEDURE:  Excision left breast mass.  SURGEON:  Gita Kudo, M.D.  ANESTHESIA:  General - endotracheal.  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  COMMENT:  Pending pathology.  CLINICAL SUMMARY:  This is a 35 year old female with a firm rubbery nodule at 6 oclock in her left breast.  An ultrasound showed that it was a probably benign lesion.  Physical examination led me to think it was a fibroadenoma, but because of its size - about 3.5 cm, and the fact that it was solitary, made [place] consider excisional biopsy.  The patient and her family wished that.  OPERATIVE FINDINGS:  There was a well circumscribed, slightly lobulated, rubbery-firm mass at about 6 oclock.  It was around 3 cm in size and I did not ut into it but it felt and looked like a fibroadenoma.  It was sent for permanent section.  DESCRIPTION OF THE PROCEDURE:  Under satisfactory general anesthesia, having received 1.0 gram Ancef preop (she has been told to always get antibiotics because of coarctation of the aorta treated as a child).  Her left breast was prepped and draped in a standard fashion and she was positioned appropriately.  A short curved incision made centered over the mass at 6 oclock and cautery used to incise into the breast tissue.  From that point on, scissors dissection was used spreading he breast tissue and delivering the fibroadenoma into the wound.  Then, cautery was used to complete the dissection and the specimen was sent intact for pathology.  The wound was made  dry by cautery, lavaged with saline.  Breast tissue approximated with interrupted 3-0 Vicryl and then the skin edges with interrupted and running 4-0 nylon.  A sterile absorbent compressive dressing was applied and the patient went to the recovery room from the operating room in good condition without complications. DD:  04/12/99 TD:  04/12/99 Job: [id number]

## 2010-07-23 NOTE — Op Note (Signed)
NAMEAngelyna, Henderson [name]                   ACCOUNT NO.:  [id number]   MEDICAL RECORD NO.:  [id number]          PATIENT TYPE:  AMB   LOCATION:  SDC                           FACILITY:  [facility]   PHYSICIAN:  Freddy Finner, M.D.   DATE OF BIRTH:  October 25, 1975   DATE OF PROCEDURE:  10/14/2005  DATE OF DISCHARGE:                                 OPERATIVE REPORT   PREOPERATIVE DIAGNOSIS:  Intrauterine pregnancy approximately [redacted] weeks  gestation, trisomy 8, chromosomal anomaly of the fetus.   POSTOPERATIVE DIAGNOSIS:  Intrauterine pregnancy approximately [redacted] weeks  gestation, trisomy 41, chromosomal anomaly of the fetus.   SECONDARY DIAGNOSIS:  Previous surgical repair of coarctation of the aorta.   OPERATIVE PROCEDURE:  Dilation and curettage with evacuation of uterine  contents with an interoperative complication of uterine perforation and  suspected bowel injury which was confirmed by secondary procedures of  diagnostic laparoscopy followed by exploratory laparotomy with repair of  uterine defect and partial resection of sigmoid colon with re-anastomosis  performed by Dr. Cicero Duck.   ANESTHESIA:  General endotracheal anesthesia.   ESTIMATED BLOOD LOSS:  500 mL.   The patient is a 35 year old primigravida who is a primary patient of Dr.  Marcelle Overlie of my practice who diagnosed trisomy 11 in the pregnancy.  With this diagnosis, Dr. Vincente Poli requested that I perform a second trimester  D&E for termination of the pregnancy.  The patient and her husband were in  agreement with this procedure and the potential complications of the  procedure were discussed.  The patient was seen in the office on the day  prior to admission at which time her physical examination was remarkable for  a holosystolic flowing murmur at the second intercostal space and for  enlargement of the uterus consistent with her 16 week pregnancy.  On the  morning of surgery, after admission to outpatient, she did receive  ampicillin and gentamicin IV for prophylaxis during the procedure.  She was  brought to the operating room and, there, placed under adequate general  anesthesia, placed in the dorsal lithotomy position using the [place] stirrups  system.  Betadine prep of the mons perineum and vagina was carried out in a  standard fashion.  A laminaria, which was placed on the day prior to  surgery, was removed from the vagina and the vagina carefully prepped.  The  cervix was grasped on the anterior lip with a single tooth tenaculum which  was later changed to a Jacobs tenaculum.  The cervix was progressively  dilated to allow passage of a 14 mm suction cannula.  This all occurred  without apparent incident.  Vacuum aspiration was applied and obvious parts  of conception including amniotic fluid, placenta, and other fetal parts were  initially identified.  Examination of the products of conception failed to  confirm the presence of the fetal head.  For that reason, repeat vacuum  aspiration and repeated explorations of the uterine cavity were carried with  placental type forceps.  The head was never identified.  In the process of  this  exploration, fatty tissue and a small segment of serosa and muscularis  consistent with bowel was identified.  Based on this finding, the vaginal  procedure was terminated.  The large polyp forceps was reapplied to the  uterus for use during the laparoscopic procedure and taped to the [facility]  tenaculum for potential use for manipulation of the uterus during the  procedure.   A Foley catheter was inserted.  The abdomen was then prepped and draped.  Two small incisions were made, one at the umbilicus and one just above the  symphysis.  Through the upper incision, an 11 mm disposable trocar was  introduced while elevating the anterior abdomen manually.  Direct inspection  revealed adequate placement within the abdominal cavity with no evidence of  injury on entry.  There was  blood and fluid noted in the abdomen on entry.  Pneumoperitoneum was allowed to accumulate using carbon dioxide gas.  A 5 mm  trocar was placed through a lower incision just above the symphysis and  through it, a blunt probe was placed for traction and exposure during the  procedure.  Examination of the upper abdomen revealed normal liver, normal  appendix.  Of note was that the fetal head was noted near the cecum floating  free in the abdomen.  The perforation was confirmed in the superior fundus  just below the apex and on the posterior uterine wall.  An irrigation system  was used to carefully examine other structures, a definite defect in the  bowel could not easily be visualized laparoscopically and it was felt  certain that this had occurred and, for that reason, the irrigating solution  was aspirated from the abdomen.  The pneumoperitoneum was left intact.  The  laparoscopic instruments were removed.  The skin incision at the umbilicus  was closed with interrupted subcuticular sutures of 3-0 Dexon.   The abdomen was then entered through a transverse lower abdominal incision  just above the symphysis which included the laparoscopic incision.  The  dissection was carried sharply down to fascia which was entered sharply and  extended transversely to the extent of the skin incision.  The rectus sheath  was developed superiorly and inferiorly with blunt and sharp dissection.  The rectus muscles were divided in the midline.  The peritoneum was entered  sharply and extended bluntly to the extent of the skin incision.  The  fascial incision was extended a bit in the midline after the arrival of Dr.  Jamey Ripa for additional exposure for the bowel procedure.  The defect in the  uterus was closed with interrupted 0 Monocryl deep sutures in the myometrium  and a running locking 0 Monocryl for superficial layer closure and reapproximation of the serosal surface of the uterus.  The fetal head was   retrieved and sent with the other tissue specimen.   After Dr. Tenna Child arrival, careful examination was carried out.  There was  a very small hole in the sigmoid colon but there was denuding of the serosa  which he felt warranted resection of a small portion of the sigmoid colon  and reanastomosis.  That procedure is described in his separate operative  note.  Copious irrigation was then carried out using a total of  approximately 4-5 liters of irrigating solution.  Copious irrigation was  carried out throughout the abdominal wall closure.  Running 0 Monocryl was  used to close the peritoneum and reapproximate the rectus muscles.  The  fascia was closed with running 0  PDS.  The subcutaneous tissue was closed  with running 2-0 plain.  The skin was closed with skin staples and 1/4 inch  Steri-Strips.  Please note that all pack, needle, and instrument counts were  determined to be correct and hemostasis was adequate before closure of the  peritoneum.   The patient was then awakened and taken to the recovery room in good  condition.  Postoperatively, an additional antibiotic of clindamycin will be  added for triple coverage which will continue in the postoperative period  until adequate progress is made in postoperative recovery.      Freddy Finner, M.D.  Electronically Signed     WRN/MEDQ  D:  10/15/2005  T:  10/15/2005  Job:  [id number]

## 2010-08-05 ENCOUNTER — Encounter: Payer: Self-pay | Admitting: Internal Medicine

## 2010-08-06 ENCOUNTER — Encounter: Payer: Self-pay | Admitting: Internal Medicine

## 2010-08-06 ENCOUNTER — Ambulatory Visit (INDEPENDENT_AMBULATORY_CARE_PROVIDER_SITE_OTHER): Payer: 59 | Admitting: Internal Medicine

## 2010-08-06 DIAGNOSIS — J069 Acute upper respiratory infection, unspecified: Secondary | ICD-10-CM

## 2010-08-06 NOTE — Patient Instructions (Signed)
Rest, fluids , tylenol For cough, take Mucinex DM twice a day as needed  Use astepro 2 sprays on each side at night x few days  Call if no better in few days Call anytime if the symptoms are severe, you have high fever, short of breath

## 2010-08-06 NOTE — Progress Notes (Signed)
  Subjective:    Patient ID: [name], female    DOB: April 16, 1975, 35 y.o.   MRN: [id number]  HPI Symptoms started 3 days ago: Sore throat, mild throat congestion, dry cough mostly at night. Symptoms are progressing and now she has abundant postnasal dripping.  Past Medical History  Diagnosis Date  . Allergic rhinitis     has seen ENT and allergy before  . History of shingles     mild during pregancy 2011  . Aorta coarctation     s/p correction 1983  . Hyperlipidemia   . Heart murmur    Past Surgical History  Procedure Date  . Breast surgery     Benign nodule removed  . Colon surgery   . Cesarean section   . Coarctation of aorta repair      Review of Systems No fever, some achiness. No nausea or vomiting No chest congestion or sore throat per se.     Objective:   Physical Exam  Constitutional: She appears well-developed and well-nourished. No distress.  HENT:  Head: Normocephalic and atraumatic.  Right Ear: External ear normal.  Left Ear: External ear normal.  Mouth/Throat: No oropharyngeal exudate.       Sinuses not tender to palpation, nose congested  Cardiovascular: Normal rate.        Has a systolic murmur  Pulmonary/Chest: Effort normal and breath sounds normal. No respiratory distress. She has no wheezes. She has no rales.          Assessment & Plan:

## 2010-08-06 NOTE — Assessment & Plan Note (Signed)
Has a URI, states many coworkers have the same sx. see instructions, if not better, consider antibiotic

## 2010-09-01 ENCOUNTER — Encounter: Payer: Self-pay | Admitting: Internal Medicine

## 2010-09-01 ENCOUNTER — Ambulatory Visit (INDEPENDENT_AMBULATORY_CARE_PROVIDER_SITE_OTHER): Payer: 59 | Admitting: Internal Medicine

## 2010-09-01 DIAGNOSIS — J3489 Other specified disorders of nose and nasal sinuses: Secondary | ICD-10-CM

## 2010-09-01 DIAGNOSIS — J329 Chronic sinusitis, unspecified: Secondary | ICD-10-CM | POA: Insufficient documentation

## 2010-09-01 MED ORDER — AMOXICILLIN 500 MG PO CAPS: 1000.0000 mg | ORAL_CAPSULE | Freq: Two times a day (BID) | ORAL | Status: AC

## 2010-09-01 NOTE — Patient Instructions (Signed)
Rest, fluids , tylenol For cough, take Robitussin (pain or  DM) as needed  Take the antibiotic as prescribed...Marland KitchenMarland KitchenAmoxicillin Call if no better in few days Call anytime if the symptoms are severe, you have high fever

## 2010-09-01 NOTE — Assessment & Plan Note (Signed)
Symptoms consistent with sinusitis, see instructions 

## 2010-09-01 NOTE — Progress Notes (Signed)
  Subjective:    Patient ID: [name], female    DOB: September 21, 1975, 35 y.o.   MRN: [id number]  HPI She was seen earlier this month with a URI, she got completely well. Got sick again 4 days ago, developed left more than right sinus congestion and pain. Daughter is also sick with  similar symptoms  Past Medical History  Diagnosis Date  . Allergic rhinitis     has seen ENT and allergy before  . History of shingles     mild during pregancy 2011  . Aorta coarctation     s/p correction 1983  . Hyperlipidemia   . Heart murmur   . Birth control     Husband's vasectomy    Past Surgical History  Procedure Date  . Breast surgery     Benign nodule removed  . Colon surgery   . Cesarean section   . Coarctation of aorta repair      Review of Systems    mild cough, mild postnasal dripping, blowing some nasal discharge. She also has subjective fever. Objective:   Physical Exam  Constitutional: She appears well-developed and well-nourished. No distress.  HENT:  Head: Normocephalic and atraumatic.  Right Ear: External ear normal.  Left Ear: External ear normal.  Nose: Nose normal.  Mouth/Throat: No oropharyngeal exudate.       Face symmetric, tender to palpation in the left maxillary area  Neck: Normal range of motion.  Cardiovascular: Normal rate and regular rhythm.        Has a heart murmur  Pulmonary/Chest: Effort normal and breath sounds normal. No respiratory distress. She has no wheezes. She has no rales.  Skin: She is not diaphoretic.          Assessment & Plan:

## 2010-09-06 ENCOUNTER — Ambulatory Visit (INDEPENDENT_AMBULATORY_CARE_PROVIDER_SITE_OTHER): Payer: 59 | Admitting: Internal Medicine

## 2010-09-06 ENCOUNTER — Encounter: Payer: Self-pay | Admitting: Internal Medicine

## 2010-09-06 DIAGNOSIS — J329 Chronic sinusitis, unspecified: Secondary | ICD-10-CM

## 2010-09-06 MED ORDER — HYDROCODONE-HOMATROPINE 5-1.5 MG/5ML PO SYRP: 5.0000 mL | ORAL_SOLUTION | Freq: Four times a day (QID) | ORAL | Status: AC | PRN

## 2010-09-06 NOTE — Progress Notes (Signed)
  Subjective:    Patient ID: [name], female    DOB: 03-Jul-1975, 35 y.o.   MRN: [id number]  HPI Was seen here a few days ago with sinusitis, started on amoxicillin, sinus pain has improved significantly however she complains of persistent cough.  Past Medical History  Diagnosis Date  . Allergic rhinitis     has seen ENT and allergy before  . History of shingles     mild during pregancy 2011  . Aorta coarctation     s/p correction 1983  . Hyperlipidemia   . Heart murmur   . Birth control     Husband's vasectomy    Past Surgical History  Procedure Date  . Breast surgery     Benign nodule removed  . Colon surgery   . Cesarean section   . Coarctation of aorta repair     Review of Systems No fever or chills Some congestion in the chest, no actual; wheezing  ++ nasal congestion and postnasal dripping. Good compliance with antibiotics. Ears are slightly congested as well    Objective:   Physical Exam  Constitutional: She appears well-developed and well-nourished. No distress.  HENT:  Head: Normocephalic and atraumatic.  Right Ear: External ear normal.  Left Ear: External ear normal.       NTTP at the face  Cardiovascular: Normal rate, regular rhythm and normal heart sounds.   No murmur heard. Pulmonary/Chest: Effort normal and breath sounds normal. No respiratory distress. She has no wheezes. She has no rales.  Musculoskeletal: She exhibits no edema.  Skin: She is not diaphoretic.          Assessment & Plan:

## 2010-09-06 NOTE — Assessment & Plan Note (Addendum)
Getting better from the sinusitis standpoint, now with severe cough. Lungs are clear.suspect irritative cough from resp infection. Plan: Cough suppression with hydrocodone, also Sudafed 30 mg 3 times a day as needed to decrease postnasal dripping. If not better  ?steroids, ? Z-Pak.

## 2010-09-06 NOTE — Patient Instructions (Signed)
Rest, fluids , tylenol For cough, take Mucinex DM twice a day x 1 week Hydrocodone if cough severe, will cause drowsiness Finish Amoxicillin Call if no better in few days (Zpack? Steroids?) Call anytime if the symptoms are severe

## 2010-12-14 LAB — CBC
HCT: 34.8 — ABNORMAL LOW
Hemoglobin: 10.1 — ABNORMAL LOW
Hemoglobin: 11.9 — ABNORMAL LOW
MCHC: 34.1
MCHC: 34.1
MCV: 82.6
MCV: 83
Platelets: 286
RBC: 3.57 — ABNORMAL LOW
RBC: 4.19
RDW: 13.2
WBC: 7.1

## 2010-12-14 LAB — URIC ACID: Uric Acid, Serum: 5.2

## 2010-12-14 LAB — LACTATE DEHYDROGENASE: LDH: 122

## 2010-12-14 LAB — COMPREHENSIVE METABOLIC PANEL
ALT: 13
AST: 20
Albumin: 2.7 — ABNORMAL LOW
Alkaline Phosphatase: 77
BUN: 8
CO2: 24
Calcium: 8.9
Chloride: 104
Creatinine, Ser: 0.74
GFR calc Af Amer: >60
GFR calc non Af Amer: >60
Glucose, Bld: 87
Potassium: 4.5
Sodium: 135
Total Bilirubin: 0.6
Total Protein: 5.3 — ABNORMAL LOW

## 2010-12-14 LAB — RPR: RPR Ser Ql: NONREACTIVE

## 2011-05-23 ENCOUNTER — Ambulatory Visit (INDEPENDENT_AMBULATORY_CARE_PROVIDER_SITE_OTHER): Payer: 59 | Admitting: Internal Medicine

## 2011-05-23 DIAGNOSIS — Z0000 Encounter for general adult medical examination without abnormal findings: Secondary | ICD-10-CM

## 2011-05-23 DIAGNOSIS — J309 Allergic rhinitis, unspecified: Secondary | ICD-10-CM

## 2011-05-23 DIAGNOSIS — Z23 Encounter for immunization: Secondary | ICD-10-CM

## 2011-05-23 LAB — COMPREHENSIVE METABOLIC PANEL
Alkaline Phosphatase: 39 U/L (ref 39–117)
BUN: 17 mg/dL (ref 6–23)
Creatinine, Ser: 0.9 mg/dL (ref 0.4–1.2)
Glucose, Bld: 83 mg/dL (ref 70–99)
Total Bilirubin: 0.4 mg/dL (ref 0.3–1.2)

## 2011-05-23 LAB — CBC WITH DIFFERENTIAL/PLATELET
Basophils Relative: 0.4 % (ref 0.0–3.0)
Eosinophils Absolute: 0.4 10*3/uL (ref 0.0–0.7)
MCHC: 33.4 g/dL (ref 30.0–36.0)
MCV: 84.6 fl (ref 78.0–100.0)
Monocytes Absolute: 0.3 10*3/uL (ref 0.1–1.0)
Neutrophils Relative %: 67.9 % (ref 43.0–77.0)
Platelets: 245 10*3/uL (ref 150.0–400.0)
RBC: 5.01 Mil/uL (ref 3.87–5.11)

## 2011-05-23 LAB — LIPID PANEL
Cholesterol: 186 mg/dL (ref 0–200)
LDL Cholesterol: 108 mg/dL — ABNORMAL HIGH (ref 0–99)
Triglycerides: 44 mg/dL (ref 0.0–149.0)
VLDL: 8.8 mg/dL (ref 0.0–40.0)

## 2011-05-23 MED ORDER — FLUTICASONE PROPIONATE 50 MCG/ACT NA SUSP: 2.0000 | Freq: Every day | NASAL | Status: DC

## 2011-05-23 NOTE — Assessment & Plan Note (Addendum)
Td-03 and 05-2011 sees gyn never had a Cscope   encouraged to continue exercising 3-4/week, diet discussed Labs FH melanoma, states she uses sunscreen and sees derm ~ 1 year

## 2011-05-23 NOTE — Progress Notes (Signed)
  Subjective:    Patient ID: [name], female    DOB: 22-Aug-1975, 36 y.o.   MRN: [id number]  HPI CPX C/o allergies x few days: PN drip, itchy eye-nose   Past Medical History: G2 P2   Allergic rhinitis has seen ENT and allergy before  CV: --Aorta coartation, s/p correction has a heart murmur  --ECHO 2003 and  2007 (reports scanned) --palpable Ao (abdmen) normal Ao, Renal U/S 12-09 mild shingels during pregnancy 2011  Past Surgical History: aorta coarctation, s/p correction  1983 breast bx 1999 D&C 2006 D&C w/ colon repair 2007 Csection 2008 Csection 12-2009  Family History: HTN - M hypothyroid-- M DM - no CAD - no stroke - no hyperlipidemia - M colon Ca - no breast Ca - no ovarian/uterine Ca - no melanoma - F (w/ late liver mets)  Social History: BC-- husband vasectomy  Married, 2  child (2008, 2011) tobacco--no ETOH-- socially  FT job, 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  Respiratory: Negative for cough and shortness of breath.   Cardiovascular: Negative for chest pain and leg swelling.  Gastrointestinal: Negative for abdominal pain and blood in stool.  Genitourinary:       Sees gyn  Psychiatric/Behavioral:       No depression or anxiety.         Objective:   Physical Exam  General:  alert, well-developed, and well-nourished.   HEENT: TMs normal, nose slt congested, throat normal, face symmetric, no TTP Lungs:  normal respiratory effort, no intercostal retractions, no accessory muscle use, and normal breath sounds.   Heart:  (+) syst. murmur III/VI, otherwise RRR Abdomen:  soft, non-tender, no distention, no guarding, and no rigidity.   palpable aorta, nontender , at the upper abdomen Extremities:   no lower extremity edema Neuro: spreach, gait and motor normal Psych:  Cognition and judgment appear intact. Alert and cooperative with normal attention span and  concentration.          Assessment & Plan:

## 2011-05-23 NOTE — Patient Instructions (Signed)
Check the  blood pressure 2 or 3 times a month, be sure it is less than 135/80. If it is consistently higher, let me know

## 2011-05-23 NOTE — Assessment & Plan Note (Signed)
See HPI, start flonase which she has used before

## 2011-05-24 ENCOUNTER — Encounter: Payer: Self-pay | Admitting: Internal Medicine

## 2011-06-28 ENCOUNTER — Ambulatory Visit: Payer: 59 | Admitting: Cardiology

## 2011-07-06 ENCOUNTER — Other Ambulatory Visit: Payer: Self-pay | Admitting: Dermatology

## 2011-07-12 ENCOUNTER — Encounter: Payer: Self-pay | Admitting: Cardiology

## 2011-07-12 ENCOUNTER — Ambulatory Visit (INDEPENDENT_AMBULATORY_CARE_PROVIDER_SITE_OTHER): Payer: 59 | Admitting: Cardiology

## 2011-07-12 DIAGNOSIS — R030 Elevated blood-pressure reading, without diagnosis of hypertension: Secondary | ICD-10-CM

## 2011-07-12 DIAGNOSIS — R011 Cardiac murmur, unspecified: Secondary | ICD-10-CM

## 2011-07-12 DIAGNOSIS — Q251 Coarctation of aorta: Secondary | ICD-10-CM

## 2011-07-12 NOTE — Progress Notes (Signed)
   HPI: Pleasant female for FU of coarctation of the aorta. Abdominal ultrasound in December of 2009 showed a normal abdominal aorta, normal renal arteries. Patient is status post repair of coarctation in 20. CTA in May of 2012 showed mild residual narrowing of the descending thoracic aorta measuring less than 30% diameter. Last echocardiogram in May 2012 showed normal LV function.the aortic valve is trileaflet with no stenosis or regurgitation. Since I last saw her in April of 2012, the patient denies any dyspnea on exertion, orthopnea, PND, pedal edema, palpitations, syncope or chest pain.   Current Outpatient Prescriptions  Medication Sig Dispense Refill  . amoxicillin (AMOXIL) 500 MG capsule Prn for dental use      . fluticasone (FLONASE) 50 MCG/ACT nasal spray Place 2 sprays into the nose daily.  16 g  12     Past Medical History  Diagnosis Date  . Allergic rhinitis     has seen ENT and allergy before  . History of shingles     mild during pregancy 2011  . Aorta coarctation     s/p correction 1983  . Hyperlipidemia   . Heart murmur   . Birth control     Husband's vasectomy    Past Surgical History  Procedure Date  . Breast surgery     Benign nodule removed  . Colon surgery   . Cesarean section   . Coarctation of aorta repair     History   Social History  . Marital Status: Married    Spouse Name: N/A    Number of Children: 2  . Years of Education: N/A   Occupational History  . STATEMENTS [employer]    Social History Main Topics  . Smoking status: Never Smoker   . Smokeless tobacco: Not on file  . Alcohol Use: Yes     Rare  . Drug Use: No  . Sexually Active: Not on file   Other Topics Concern  . Not on file   Social History Narrative  . No narrative on file    ROS: no fevers or chills, productive cough, hemoptysis, dysphasia, odynophagia, melena, hematochezia, dysuria, hematuria, rash, seizure activity, orthopnea, PND, pedal edema, claudication. Remaining  systems are negative.  Physical Exam: Well-developed well-nourished in no acute distress.  Skin is warm and dry.  HEENT is normal.  Neck is supple.  Chest is clear to auscultation with normal expansion.  Cardiovascular exam is regular rate and rhythm. 2/6 systolic ejection murmur. Abdominal exam nontender or distended. No masses palpated. Extremities show no edema. neuro grossly intact  ECG sinus rhythm at a rate of 66. Right axis deviation. Prior septal infarct.

## 2011-07-12 NOTE — Assessment & Plan Note (Signed)
Patient with no symptoms and most recent CTA showed only mild residual stenosis. Plan followup CTAs in the future.

## 2011-07-12 NOTE — Assessment & Plan Note (Signed)
Blood pressure mildly elevated. She does check this at home and it is typically in the 120s over 80s. We will follow and increase her medications as needed.

## 2011-07-12 NOTE — Assessment & Plan Note (Signed)
Last echocardiogram showed no aortic stenosis and her aortic valve was trileaflet.

## 2011-07-12 NOTE — Patient Instructions (Signed)
Your physician wants you to follow-up in: TWO YEARS WITH DR CRENSHAW You will receive a reminder letter in the mail two months in advance. If you don't receive a letter, please call our office to schedule the follow-up appointment.  

## 2012-01-02 ENCOUNTER — Ambulatory Visit (INDEPENDENT_AMBULATORY_CARE_PROVIDER_SITE_OTHER): Payer: 59 | Admitting: Internal Medicine

## 2012-01-02 ENCOUNTER — Encounter: Payer: Self-pay | Admitting: Internal Medicine

## 2012-01-02 VITALS — BP 128/82 | HR 63 | Temp 98.4°F | Wt 132.0 lb

## 2012-01-02 DIAGNOSIS — J329 Chronic sinusitis, unspecified: Secondary | ICD-10-CM

## 2012-01-02 MED ORDER — AMOXICILLIN 500 MG PO CAPS: 1000.0000 mg | ORAL_CAPSULE | Freq: Two times a day (BID) | ORAL | Status: AC

## 2012-01-02 NOTE — Patient Instructions (Addendum)
Rest, fluids , tylenol For cough, take Mucinex DM twice a day as needed  For congestion use flonase and also ok to take sudafed 30 mg (behind the counter) if needed  X few days  Take the antibiotic as prescribed  (Amoxicillin) Call if no better in few days Call anytime if the symptoms are severe

## 2012-01-02 NOTE — Progress Notes (Signed)
  Subjective:    Patient ID: [name], female    DOB: December 14, 1975, 36 y.o.   MRN: [id number]  HPI Acute visit URI type of symptoms 2 weeks ago, the symptoms are gone except for left maxillary sinus pain, postnasal dripping, drip is greenish in color.  Past Medical History: G2 P2    Allergic rhinitis has seen ENT and allergy before   CV: --Aorta coartation, s/p correction has a heart murmur   --ECHO 2003 and  2007 (reports scanned) --palpable Ao (abdmen) normal Ao, Renal U/S 12-09 mild shingels during pregnancy 2011  Past Surgical History: aorta coarctation, s/p correction  1983 breast bx 1999 D&C 2006 D&C w/ colon repair 2007 Csection 2008 Csection 12-2009  Family History: HTN - M hypothyroid-- M DM - no CAD - no stroke - no hyperlipidemia - M colon Ca - no breast Ca - no ovarian/uterine Ca - no melanoma - F (w/ late liver mets)  Social History: BC-- husband vasectomy   Married, 2  child (2008, 2011) tobacco--no ETOH-- socially   FT job, 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 Use Flonase as needed Had a flu shot already    Objective:   Physical Exam General -- alert, well-developed, and well-nourished.   HEENT -- TMs normal, throat w/o redness, face symmetric and +/+++ tender to palpation @ the L maxilary sinus  Lungs -- normal respiratory effort, no intercostal retractions, no accessory muscle use, and normal breath sounds.   Heart-- normal rate, regular rhythm, no murmur, and no gallop.    Neurologic-- alert & oriented X3 and strength normal in all extremities. Psych-- Cognition and judgment appear intact. Alert and cooperative with normal attention span and concentration.  not anxious appearing and not depressed appearing.        Assessment & Plan:  Sinusitis, Symptoms and findings consistent with sinusitis. See  instructions

## 2012-02-07 ENCOUNTER — Other Ambulatory Visit: Payer: Self-pay | Admitting: Obstetrics and Gynecology

## 2012-02-16 ENCOUNTER — Ambulatory Visit (INDEPENDENT_AMBULATORY_CARE_PROVIDER_SITE_OTHER): Payer: 59 | Admitting: Internal Medicine

## 2012-02-16 ENCOUNTER — Encounter: Payer: Self-pay | Admitting: Internal Medicine

## 2012-02-16 VITALS — BP 122/78 | HR 67 | Temp 98.6°F | Wt 132.4 lb

## 2012-02-16 DIAGNOSIS — J209 Acute bronchitis, unspecified: Secondary | ICD-10-CM

## 2012-02-16 MED ORDER — HYDROCODONE-HOMATROPINE 5-1.5 MG/5ML PO SYRP: 5.0000 mL | ORAL_SOLUTION | Freq: Four times a day (QID) | ORAL | Status: DC | PRN

## 2012-02-16 MED ORDER — AZITHROMYCIN 250 MG PO TABS: ORAL_TABLET | ORAL | Status: DC

## 2012-02-16 NOTE — Patient Instructions (Addendum)
Plain Mucinex for thick secretions ;force NON dairy fluids . Use a Neti pot daily as needed for sinus congestion; going from open side to congested side . Nasal cleansing in the shower as discussed. Make sure that all residual soap is removed to prevent irritation. Fluticasone 1 spray in each nostril twice a day as needed. Use the "crossover" technique as discussed. Plain Allegra 160 daily as needed for itchy eyes & sneezing.    

## 2012-02-16 NOTE — Progress Notes (Signed)
  Subjective:    Patient ID: [name], female    DOB: May 04, 1975, 36 y.o.   MRN: [id number]  HPI Symptoms began 11/12/11 as a sore throat associated with malaise. She subsequently developed some ear discomfort without discharge. She also had postnasal drainage with a dry cough as of 11/15/11. She's had miniscule nasal secretions. She may have had a slight fever presenting as aching.  She's never smoked; she has no history of asthma.  Mucinex DM helps some    Review of Systems She denies extrinsic symptoms of itchy, watery eyes or sneezing. She is also had no frontal headache,or facial pain. She has no shortness of breath or wheezing with the cough     Objective:   Physical Exam  General appearance: thin ,in good health ;well nourished; no acute distress or increased work of breathing is present.  No  lymphadenopathy about the head, neck, or axilla noted.   Eyes: No conjunctival inflammation or lid edema is present.   Ears:  External ear exam shows no significant lesions or deformities.  Otoscopic examination reveals clear canals, tympanic membranes are intact bilaterally without bulging, retraction, inflammation or discharge.  Nose:  External nasal examination shows no deformity or inflammation. Nasal mucosa are pink and moist without lesions or exudates. No septal dislocation or deviation.No obstruction to airflow.   Oral exam: Dental hygiene is good; lips and gums are healthy appearing.There is no oropharyngeal erythema or exudate noted.   Neck:  No deformities,  masses, or tenderness noted.      Heart:  Normal rate and regular rhythm. Accentuated  S2 ; no gallop,  click, rub or other extra sounds. Grade 1.5/6 systolic murmur   Lungs:Chest clear to auscultation; no wheezes, rhonchi,rales ,or rubs present.No increased work of breathing.  Dry cough  Extremities:  No cyanosis, edema, or clubbing  noted    Skin: Warm & damp.         Assessment & Plan:  #1 acute bronchitis w/o  bronchospasm Plan: See orders and recommendations

## 2012-04-21 ENCOUNTER — Other Ambulatory Visit: Payer: Self-pay

## 2012-05-23 ENCOUNTER — Encounter: Payer: Self-pay | Admitting: Lab

## 2012-05-24 ENCOUNTER — Ambulatory Visit (INDEPENDENT_AMBULATORY_CARE_PROVIDER_SITE_OTHER): Payer: 59 | Admitting: Internal Medicine

## 2012-05-24 ENCOUNTER — Encounter: Payer: Self-pay | Admitting: Internal Medicine

## 2012-05-24 VITALS — BP 126/82 | HR 67 | Temp 97.5°F | Ht 65.0 in | Wt 131.0 lb

## 2012-05-24 DIAGNOSIS — Z0000 Encounter for general adult medical examination without abnormal findings: Secondary | ICD-10-CM

## 2012-05-24 LAB — CBC WITH DIFFERENTIAL/PLATELET
Basophils Relative: 0.8 % (ref 0.0–3.0)
Eosinophils Relative: 3.6 % (ref 0.0–5.0)
HCT: 45 % (ref 36.0–46.0)
Hemoglobin: 14.9 g/dL (ref 12.0–15.0)
Lymphs Abs: 1.8 10*3/uL (ref 0.7–4.0)
MCV: 82.6 fl (ref 78.0–100.0)
Monocytes Absolute: 0.3 10*3/uL (ref 0.1–1.0)
Neutro Abs: 2.6 10*3/uL (ref 1.4–7.7)
Platelets: 314 10*3/uL (ref 150.0–400.0)
RBC: 5.45 Mil/uL — ABNORMAL HIGH (ref 3.87–5.11)
WBC: 5 10*3/uL (ref 4.5–10.5)

## 2012-05-24 LAB — LIPID PANEL: Triglycerides: 66 mg/dL (ref 0.0–149.0)

## 2012-05-24 MED ORDER — FLUTICASONE PROPIONATE 50 MCG/ACT NA SUSP: 2.0000 | Freq: Every day | NASAL | Status: DC

## 2012-05-24 NOTE — Assessment & Plan Note (Addendum)
Td-03 and 05-2011 sees gyn never had a Cscope  Doing great w/ life style Labs FH melanoma,   sees derm ~ 1 year Saw cards 2013 was rec to RTC in 2-3 years

## 2012-05-24 NOTE — Progress Notes (Signed)
  Subjective:    Patient ID: [name], female    DOB: April 25, 1975, 37 y.o.   MRN: [id number]  HPI CPX  Past Medical History  Diagnosis Date  . Allergic rhinitis     has seen ENT and allergy before  . History of shingles     mild during pregancy 2011  . Aorta coarctation     s/p correction 1983  . Hyperlipidemia   . Heart murmur   . Birth control     Husband's vasectomy  . Palpable abd. aorta      normal Aorta and renal U/S 12-09   Past Surgical History  Procedure Laterality Date  . Breast surgery  1999    Benign nodule removed  . Colon surgery  2007    complication from [facility]  . Cesarean section      x2  . Coarctation of aorta repair  1983  . Dilation and curettage of uterus  2006   Family History  Problem Relation Age of Onset  . Hypertension Mother   . Hyperlipidemia Mother   . Hypothyroidism Mother   . Diabetes Neg Hx   . Coronary artery disease Neg Hx   . Stroke Neg Hx   . Colon cancer Neg Hx   . Breast cancer Neg Hx   . Melanoma Father    History   Social History  . Marital Status: Married    Spouse Name: N/A    Number of Children: 2  . Years of Education: N/A   Occupational History  . [employer]    Social History Main Topics  . Smoking status: Never Smoker   . Smokeless tobacco: Never Used  . Alcohol Use: Yes     Comment: Rare  . Drug Use: No  . Sexually Active: Not on file   Other Topics Concern  . Not on file   Social History Narrative   P4G2   Diet--healthy   Exercise-- runner x 3-4/week     Review of Systems  Respiratory: Negative for cough and shortness of breath.   Cardiovascular: Negative for chest pain and leg swelling.  Gastrointestinal: Negative for abdominal pain and blood in stool.  Genitourinary: Negative for dysuria and difficulty urinating.  Psychiatric/Behavioral:       No anxiety-depression       Objective:   Physical Exam General -- alert, well-developed  Neck --no thyromegaly Lungs -- normal  respiratory effort, no intercostal retractions, no accessory muscle use, and normal breath sounds.   Heart-- normal rate, regular rhythm, + syst  murmur, and no gallop.   Abdomen--soft, non-tender, no distention, palpable non tender Ao w/o bruit (upper abd).   Extremities-- no pretibial edema bilaterally  Neurologic-- alert & oriented X3 and strength normal in all extremities. Psych-- Cognition and judgment appear intact. Alert and cooperative with normal attention span and concentration.  not anxious appearing and not depressed appearing.       Assessment & Plan:

## 2012-05-25 ENCOUNTER — Encounter: Payer: Self-pay | Admitting: Internal Medicine

## 2012-09-22 ENCOUNTER — Emergency Department (HOSPITAL_BASED_OUTPATIENT_CLINIC_OR_DEPARTMENT_OTHER)
Admission: EM | Admit: 2012-09-22 | Discharge: 2012-09-22 | Disposition: A | Discharge status: Home / Self Care | Payer: 59 | Attending: Emergency Medicine | Admitting: Emergency Medicine

## 2012-09-22 ENCOUNTER — Emergency Department (HOSPITAL_BASED_OUTPATIENT_CLINIC_OR_DEPARTMENT_OTHER): Payer: 59

## 2012-09-22 ENCOUNTER — Encounter (HOSPITAL_BASED_OUTPATIENT_CLINIC_OR_DEPARTMENT_OTHER): Payer: Self-pay | Admitting: Emergency Medicine

## 2012-09-22 DIAGNOSIS — Z8679 Personal history of other diseases of the circulatory system: Secondary | ICD-10-CM | POA: Insufficient documentation

## 2012-09-22 DIAGNOSIS — S0180XA Unspecified open wound of other part of head, initial encounter: Secondary | ICD-10-CM | POA: Insufficient documentation

## 2012-09-22 DIAGNOSIS — Z8639 Personal history of other endocrine, nutritional and metabolic disease: Secondary | ICD-10-CM | POA: Insufficient documentation

## 2012-09-22 DIAGNOSIS — S8990XA Unspecified injury of unspecified lower leg, initial encounter: Secondary | ICD-10-CM | POA: Insufficient documentation

## 2012-09-22 DIAGNOSIS — Z8709 Personal history of other diseases of the respiratory system: Secondary | ICD-10-CM | POA: Insufficient documentation

## 2012-09-22 DIAGNOSIS — Y9302 Activity, running: Secondary | ICD-10-CM | POA: Insufficient documentation

## 2012-09-22 DIAGNOSIS — Y929 Unspecified place or not applicable: Secondary | ICD-10-CM | POA: Insufficient documentation

## 2012-09-22 DIAGNOSIS — Z8619 Personal history of other infectious and parasitic diseases: Secondary | ICD-10-CM | POA: Insufficient documentation

## 2012-09-22 DIAGNOSIS — R011 Cardiac murmur, unspecified: Secondary | ICD-10-CM | POA: Insufficient documentation

## 2012-09-22 DIAGNOSIS — Z862 Personal history of diseases of the blood and blood-forming organs and certain disorders involving the immune mechanism: Secondary | ICD-10-CM | POA: Insufficient documentation

## 2012-09-22 DIAGNOSIS — IMO0002 Reserved for concepts with insufficient information to code with codable children: Secondary | ICD-10-CM | POA: Insufficient documentation

## 2012-09-22 DIAGNOSIS — S99929A Unspecified injury of unspecified foot, initial encounter: Secondary | ICD-10-CM | POA: Insufficient documentation

## 2012-09-22 DIAGNOSIS — W010XXA Fall on same level from slipping, tripping and stumbling without subsequent striking against object, initial encounter: Secondary | ICD-10-CM | POA: Insufficient documentation

## 2012-09-22 DIAGNOSIS — T148XXA Other injury of unspecified body region, initial encounter: Secondary | ICD-10-CM | POA: Insufficient documentation

## 2012-09-22 MED ORDER — TRAMADOL HCL 50 MG PO TABS: 50.0000 mg | ORAL_TABLET | Freq: Four times a day (QID) | ORAL | Status: DC | PRN

## 2012-09-22 NOTE — ED Provider Notes (Signed)
History    CSN: [id number] Arrival date & time 09/22/12  4098  First MD Initiated Contact with Patient 09/22/12 [phone]     Chief Complaint  Patient presents with  . Fall   (Consider location/radiation/quality/duration/timing/severity/associated sxs/prior Treatment) HPI Comments: Patient presents to the ER for evaluation after fall. Patient was running this morning, tripped and fell. She tried to brace herself with her arms, injured her right hand. Patient also complains of left knee pain. She did hit her chin on the ground, has a laceration under the chin which is mildly painful. Bleeding has stopped with pressure. There was no loss of consciousness. Patient denies headache. Pain in the hand is moderate, worsens with movement.  Patient is a 37 y.o. female presenting with fall.  Fall Pertinent negatives include no chest pain.   Past Medical History  Diagnosis Date  . Allergic rhinitis     has seen ENT and allergy before  . History of shingles     mild during pregancy 2011  . Aorta coarctation     s/p correction 1983  . Hyperlipidemia   . Heart murmur   . Birth control     Husband's vasectomy  . Palpable abd. aorta      normal Aorta and renal U/S 12-09   Past Surgical History  Procedure Laterality Date  . Breast surgery  1999    Benign nodule removed  . Colon surgery  2007    complication from [facility]  . Cesarean section      x2  . Coarctation of aorta repair  1983  . Dilation and curettage of uterus  2006   Family History  Problem Relation Age of Onset  . Hypertension Mother   . Hyperlipidemia Mother   . Hypothyroidism Mother   . Diabetes Neg Hx   . Coronary artery disease Neg Hx   . Stroke Neg Hx   . Colon cancer Neg Hx   . Breast cancer Neg Hx   . Melanoma Father    History  Substance Use Topics  . Smoking status: Never Smoker   . Smokeless tobacco: Never Used  . Alcohol Use: Yes     Comment: Rare   OB History   Grav Para Term Preterm Abortions TAB SAB Ect  Mult Living                 Review of Systems  HENT: Negative for neck pain.   Cardiovascular: Negative for chest pain.  Musculoskeletal: Negative for back pain.  Skin: Positive for wound.  All other systems reviewed and are negative.    Allergies  Review of patient's allergies indicates no known allergies.  Home Medications   Current Outpatient Rx  Name  Route  Sig  Dispense  Refill  . fluticasone (FLONASE) 50 MCG/ACT nasal spray   Nasal   Place 2 sprays into the nose daily.   16 g   12    BP 118/70  Pulse 69  Temp(Src) 97.6 F (36.4 C) (Oral)  Resp 16  Ht 5\' 5"  (1.651 m)  Wt 130 lb (58.968 kg)  BMI 21.63 kg/m2  SpO2 100%  LMP 09/15/2012 Physical Exam  Constitutional: She is oriented to person, place, and time. She appears well-developed and well-nourished. No distress.  HENT:  Head: Normocephalic. Head is with laceration.    Right Ear: Hearing normal.  Left Ear: Hearing normal.  Nose: Nose normal.  Mouth/Throat: Oropharynx is clear and moist and mucous membranes are normal.  Eyes: Conjunctivae  and EOM are normal. Pupils are equal, round, and reactive to light.  Neck: Normal range of motion. Neck supple.  Cardiovascular: Regular rhythm, S1 normal and S2 normal.  Exam reveals no gallop and no friction rub.   No murmur heard. Pulmonary/Chest: Effort normal and breath sounds normal. No respiratory distress. She exhibits no tenderness.  Abdominal: Soft. Normal appearance and bowel sounds are normal. There is no hepatosplenomegaly. There is no tenderness. There is no rebound, no guarding, no tenderness at McBurney's point and negative Murphy's sign. No hernia.  Musculoskeletal: Normal range of motion.       Left knee: She exhibits normal range of motion, no swelling, no effusion, no ecchymosis, no deformity, no laceration and no erythema. Tenderness found.       Cervical back: Normal.       Thoracic back: Normal.       Lumbar back: Normal.       Hands:       Legs: Neurological: She is alert and oriented to person, place, and time. She has normal strength. No cranial nerve deficit or sensory deficit. Coordination normal. GCS eye subscore is 4. GCS verbal subscore is 5. GCS motor subscore is 6.  Skin: Skin is warm and dry. Abrasion and laceration noted. No rash noted. No cyanosis.  Psychiatric: She has a normal mood and affect. Her speech is normal and behavior is normal. Thought content normal.    ED Course  Procedures (including critical care time)  LACERATION REPAIR Performed by: Gilda Crease. Authorized by: Gilda Crease Consent: Verbal consent obtained. Risks and benefits: risks, benefits and alternatives were discussed Consent given by: patient Patient identity confirmed: provided demographic data Prepped and Draped in normal sterile fashion Wound explored  Laceration Location: face (chin)  Laceration Length: 1cm  No Foreign Bodies seen or palpated  Anesthesia: local infiltration  Local anesthetic: lidocaine 1% without epinephrine  Anesthetic total: 1 ml  Irrigation method: syringe Amount of cleaning: standard  Skin closure: suture  Number of sutures: 2  Technique: simple interrupted  Patient tolerance: Patient tolerated the procedure well with no immediate complications.  Labs Reviewed - No data to display No results found.  Diagnosis: 1. Laceration 2. Abrasion 3. Contusion  MDM  Patient presents to ER after a fall. Patient has a laceration under the chin that required repair. 2 sutures were placed will be removed in 7 days. Patient complaining of left knee and right hand pain. X-rays of these areas were negative for fracture. Patient did not have any obvious signs of mandibular fracture. She has normal range of motion without pain, but was very concerned about the possibility of fracture, patient therefore had mandibular x-ray performed. No fracture was seen.  Gilda Crease,  MD 09/23/12 0700

## 2012-09-22 NOTE — ED Notes (Signed)
Pt reports out running this am, tripped and fell on concrete. C/o pain to chin, Right hand, and left knee. Denies hitting head or LOC. Not on blood thinners.

## 2012-09-22 NOTE — ED Notes (Signed)
MD at bedside. 

## 2012-09-22 NOTE — ED Notes (Addendum)
Patient transported to X-ray via stretcher 

## 2012-09-28 ENCOUNTER — Ambulatory Visit (INDEPENDENT_AMBULATORY_CARE_PROVIDER_SITE_OTHER): Payer: 59 | Admitting: Family Medicine

## 2012-09-28 ENCOUNTER — Encounter: Payer: Self-pay | Admitting: Family Medicine

## 2012-09-28 ENCOUNTER — Ambulatory Visit: Payer: 59 | Admitting: Internal Medicine

## 2012-09-28 VITALS — BP 120/70 | HR 80 | Temp 98.3°F | Wt 132.6 lb

## 2012-09-28 DIAGNOSIS — S0181XA Laceration without foreign body of other part of head, initial encounter: Secondary | ICD-10-CM | POA: Insufficient documentation

## 2012-09-28 DIAGNOSIS — S0180XA Unspecified open wound of other part of head, initial encounter: Secondary | ICD-10-CM

## 2012-09-28 NOTE — Progress Notes (Signed)
  Subjective:    Patient ID: [name], female    DOB: January 20, 1976, 37 y.o.   MRN: [id number]  HPI Facial laceration- pt fell while running on Saturday at [facility].  Road rash on L knee and facial laceration under R side of chin.  Required 2 stitches in ER.  Pt here today for suture removal.  No pain, some bruising.  No drainage.  No fevers.   Review of Systems For ROS see HPI     Objective:   Physical Exam  Vitals reviewed. Constitutional: She appears well-developed and well-nourished. No distress.  Skin: Skin is warm and dry.  Small inverted V shaped laceration on underside of R chin.  Edges well approximated.  No evidence of infxn.  2 sutures removed after prep w/ H2O2.  abx ointment and bandaid applied.          Assessment & Plan:

## 2012-09-28 NOTE — Assessment & Plan Note (Signed)
New.  Wound is well healing, no evidence of infxn.  2 sutures removed w/out difficulty or complication.  Wound care advice given.  Pt expressed understanding and is in agreement w/ plan.

## 2012-09-28 NOTE — Patient Instructions (Addendum)
Keep area clean and dry Ok to shower daily and swim once area is scabbed Apply Neosporin twice daily Call with any questions or concerns Hang in there!!

## 2013-02-07 ENCOUNTER — Other Ambulatory Visit: Payer: Self-pay | Admitting: Obstetrics and Gynecology

## 2013-03-05 ENCOUNTER — Ambulatory Visit (INDEPENDENT_AMBULATORY_CARE_PROVIDER_SITE_OTHER): Payer: 59 | Admitting: Family Medicine

## 2013-03-05 ENCOUNTER — Encounter: Payer: Self-pay | Admitting: Family Medicine

## 2013-03-05 VITALS — BP 118/72 | HR 91 | Temp 99.1°F | Wt 135.0 lb

## 2013-03-05 DIAGNOSIS — R509 Fever, unspecified: Secondary | ICD-10-CM

## 2013-03-05 DIAGNOSIS — J209 Acute bronchitis, unspecified: Secondary | ICD-10-CM

## 2013-03-05 MED ORDER — AZITHROMYCIN 250 MG PO TABS: ORAL_TABLET | ORAL | Status: DC

## 2013-03-05 MED ORDER — GUAIFENESIN-CODEINE 100-10 MG/5ML PO SYRP: ORAL_SOLUTION | ORAL | Status: DC

## 2013-03-05 NOTE — Progress Notes (Signed)
Pre visit review using our clinic review tool, if applicable. No additional management support is needed unless otherwise documented below in the visit note. 

## 2013-03-05 NOTE — Addendum Note (Signed)
Addended by: Arnette Norris on: 03/05/2013 04:41 PM   Modules accepted: Orders

## 2013-03-05 NOTE — Progress Notes (Signed)
  Subjective:     [name] is a 37 y.o. female here for evaluation of a cough. Onset of symptoms was 4 days ago. Symptoms have been gradually worsening since that time. The cough is productive and is aggravated by infection and reclining position. Associated symptoms include: chills, fever, postnasal drip and sputum production. Patient does not have a history of asthma. Patient does not have a history of environmental allergens. Patient has not traveled recently. Patient does not have a history of smoking. Patient has not had a previous chest x-ray. Patient has not had a PPD done.  The following portions of the patient's history were reviewed and updated as appropriate: allergies, current medications, past family history, past medical history, past social history, past surgical history and problem list.  Review of Systems Pertinent items are noted in HPI.    Objective:    Oxygen saturation 98% on room air BP 118/72  Pulse 91  Temp(Src) 99.1 F (37.3 C) (Oral)  Wt 135 lb (61.236 kg)  SpO2 98% General appearance: alert, cooperative, appears stated age and no distress Ears: normal TM's and external ear canals both ears Nose: Nares normal. Septum midline. Mucosa normal. No drainage or sinus tenderness. Throat: lips, mucosa, and tongue normal; teeth and gums normal Neck: mild anterior cervical adenopathy, supple, symmetrical, trachea midline and thyroid not enlarged, symmetric, no tenderness/mass/nodules Lungs: diminished breath sounds LLL Heart: S1, S2 normal    Assessment:    Acute Bronchitis    Plan:    Antibiotics per medication orders. Antitussives per medication orders. Avoid exposure to tobacco smoke and fumes. Call if shortness of breath worsens, blood in sputum, change in character of cough, development of fever or chills, inability to maintain nutrition and hydration. Avoid exposure to tobacco smoke and fumes. f/u prn

## 2013-03-05 NOTE — Patient Instructions (Signed)

## 2013-05-23 ENCOUNTER — Telehealth: Payer: Self-pay

## 2013-05-23 NOTE — Telephone Encounter (Signed)
Medication and allergies:  Reviewed and updated  90 day supply/mail order: na Local pharmacy: [facility] due:  UTD  A/P:   No changes to FH, PSH or Personal Hx Flu vaccine--12/2012 Tdap--05/2011 Pap--02/2013--Dr Grewal--nml CCS--Never had MMG--breast bx 1999  To Discuss with Provider: Need refill on flonase

## 2013-05-27 ENCOUNTER — Ambulatory Visit (INDEPENDENT_AMBULATORY_CARE_PROVIDER_SITE_OTHER): Payer: 59 | Admitting: Internal Medicine

## 2013-05-27 ENCOUNTER — Encounter: Payer: Self-pay | Admitting: Internal Medicine

## 2013-05-27 VITALS — BP 133/80 | HR 76 | Temp 98.1°F | Ht 64.0 in | Wt 138.4 lb

## 2013-05-27 DIAGNOSIS — Z0000 Encounter for general adult medical examination without abnormal findings: Secondary | ICD-10-CM

## 2013-05-27 LAB — COMPREHENSIVE METABOLIC PANEL
ALK PHOS: 35 U/L — AB (ref 39–117)
ALT: 16 U/L (ref 0–35)
AST: 20 U/L (ref 0–37)
Albumin: 4.9 g/dL (ref 3.5–5.2)
BILIRUBIN TOTAL: 0.8 mg/dL (ref 0.3–1.2)
BUN: 10 mg/dL (ref 6–23)
CALCIUM: 9.6 mg/dL (ref 8.4–10.5)
CO2: 23 mEq/L (ref 19–32)
CREATININE: 0.9 mg/dL (ref 0.4–1.2)
Chloride: 106 mEq/L (ref 96–112)
GFR: 75.89 mL/min (ref >60.00)
Glucose, Bld: 80 mg/dL (ref 70–99)
Potassium: 4.1 mEq/L (ref 3.5–5.1)
Sodium: 140 mEq/L (ref 135–145)
Total Protein: 7.4 g/dL (ref 6.0–8.3)

## 2013-05-27 LAB — LIPID PANEL
CHOL/HDL RATIO: 3
Cholesterol: 204 mg/dL — ABNORMAL HIGH (ref 0–200)
HDL: 69 mg/dL (ref >39.00)
LDL CALC: 125 mg/dL — AB (ref 0–99)
TRIGLYCERIDES: 51 mg/dL (ref 0.0–149.0)
VLDL: 10.2 mg/dL (ref 0.0–40.0)

## 2013-05-27 LAB — CBC WITH DIFFERENTIAL/PLATELET
BASOS PCT: 0.5 % (ref 0.0–3.0)
Basophils Absolute: 0 10*3/uL (ref 0.0–0.1)
EOS ABS: 0.1 10*3/uL (ref 0.0–0.7)
Eosinophils Relative: 2.6 % (ref 0.0–5.0)
HEMATOCRIT: 40.7 % (ref 36.0–46.0)
HEMOGLOBIN: 13.3 g/dL (ref 12.0–15.0)
LYMPHS ABS: 1.7 10*3/uL (ref 0.7–4.0)
LYMPHS PCT: 30.2 % (ref 12.0–46.0)
MCHC: 32.8 g/dL (ref 30.0–36.0)
MCV: 85.6 fl (ref 78.0–100.0)
Monocytes Absolute: 0.3 10*3/uL (ref 0.1–1.0)
Monocytes Relative: 5.8 % (ref 3.0–12.0)
NEUTROS ABS: 3.5 10*3/uL (ref 1.4–7.7)
Neutrophils Relative %: 60.9 % (ref 43.0–77.0)
Platelets: 244 10*3/uL (ref 150.0–400.0)
RBC: 4.76 Mil/uL (ref 3.87–5.11)
RDW: 13.9 % (ref 11.5–14.6)
WBC: 5.8 10*3/uL (ref 4.5–10.5)

## 2013-05-27 LAB — TSH: TSH: 1.48 u[IU]/mL (ref 0.35–5.50)

## 2013-05-27 MED ORDER — FLUTICASONE PROPIONATE 50 MCG/ACT NA SUSP: 2.0000 | Freq: Every day | NASAL | Status: DC

## 2013-05-27 NOTE — Progress Notes (Signed)
Pre visit review using our clinic review tool, if applicable. No additional management support is needed unless otherwise documented below in the visit note. 

## 2013-05-27 NOTE — Patient Instructions (Signed)
Get your blood work before you leave   Next visit is for a physical exam in 1 year,  fasting Please make an appointment     At some point this year the clinic will relocate to  [facility] [place],  [address] (10 minutes form here)  [address]  [place], [place] [address] [phone]  

## 2013-05-27 NOTE — Progress Notes (Signed)
Subjective:    Patient ID: [name], female    DOB: [birth year], 38 y.o.   MRN: [id number]  DOS:  05/27/2013 Type of  visit: CPX In general feeling great, needs a refill on Flonase  ROS Diet-- ok most of the time  Exercise-- very active , runner  No  CP, SOB No palpitations, no lower extremity edema Denies  nausea, vomiting diarrhea No abdominal pain Denies  blood in the stools  (-) cough, sputum production (-) wheezing, chest congestion  No anxiety, depression    Past Medical History  Diagnosis Date  . Allergic rhinitis     has seen ENT and allergy before  . History of shingles     mild during pregancy 2011  . Aorta coarctation     s/p correction 1983  . Hyperlipidemia   . Heart murmur   . Birth control     Husband's vasectomy  . Palpable abd. aorta      normal Aorta and renal U/S 12-09    Past Surgical History  Procedure Laterality Date  . Breast surgery  1999    Benign nodule removed  . Colon surgery  8786    complication from [facility]  . Cesarean section      x2  . Coarctation of aorta repair  1983  . Dilation and curettage of uterus  2006    History   Social History  . Marital Status: Married    Spouse Name: N/A    Number of Children: 2  . Years of Education: N/A   Occupational History  . [employer]    Social History Main Topics  . Smoking status: Never Smoker   . Smokeless tobacco: Never Used  . Alcohol Use: Yes     Comment: Rare  . Drug Use: No  . Sexual Activity: Not on file   Other Topics Concern  . Not on file   Social History Narrative   P4G2         Family History  Problem Relation Age of Onset  . Hypertension Mother   . Hyperlipidemia Mother   . Hypothyroidism Mother   . Diabetes Neg Hx   . Coronary artery disease Neg Hx   . Stroke Neg Hx   . Colon cancer Neg Hx   . Breast cancer Neg Hx   . Melanoma Father       Medication List       This list is accurate as of: 05/27/13  1:09 PM.  Always use your most  recent med list.               fluticasone 50 MCG/ACT nasal spray  Commonly known as:  FLONASE  Place 2 sprays into both nostrils daily.           Objective:   Physical Exam BP 133/80  Pulse 76  Temp(Src) 98.1 F (36.7 C) (Oral)  Ht 5\' 4"  (1.626 m)  Wt 138 lb 6.4 oz (62.778 kg)  BMI 23.74 kg/m2  SpO2 100%  LMP 05/08/2013  General -- alert, well-developed, NAD.  Neck --no thyromegaly , normal carotid pulse  HEENT-- Not pale.  Lungs -- normal respiratory effort, no intercostal retractions, no accessory muscle use, and normal breath sounds.  Heart-- normal rate, regular rhythm, + syst murmur.  Abdomen-- Not distended, good bowel sounds,soft, non-tender. Palpable non tender Ao, no bruit Extremities-- no pretibial edema bilaterally  Neurologic--  alert & oriented X3. Speech normal, gait normal, strength normal in all  extremities.  Psych-- Cognition and judgment appear intact. Cooperative with normal attention span and concentration. No anxious or depressed appearing.        Assessment & Plan:

## 2013-05-27 NOTE — Assessment & Plan Note (Addendum)
Td-03 05-2011 sees gyn (Dr Helane Rima) , plans to start routine MMGs this year never had a Cscope  Doing great w/ exercise-diet, w/ only occasionally dietary indiscretion.  Labs FH melanoma,   sees derm ~ 1 year Saw cards 2013 was rec to RTC in 2-3 years; actually plans to see cards this year

## 2013-07-25 ENCOUNTER — Encounter: Payer: Self-pay | Admitting: Cardiology

## 2013-07-25 ENCOUNTER — Ambulatory Visit (INDEPENDENT_AMBULATORY_CARE_PROVIDER_SITE_OTHER): Payer: 59 | Admitting: Cardiology

## 2013-07-25 VITALS — BP 132/82 | HR 70 | Ht 64.0 in | Wt 134.8 lb

## 2013-07-25 DIAGNOSIS — R011 Cardiac murmur, unspecified: Secondary | ICD-10-CM

## 2013-07-25 DIAGNOSIS — Q251 Coarctation of aorta: Secondary | ICD-10-CM

## 2013-07-25 NOTE — Progress Notes (Signed)
      HPI: Pleasant female for FU of coarctation of the aorta. Abdominal ultrasound in December of 2009 showed a normal abdominal aorta, normal renal arteries. Patient is status post repair of coarctation in 57. CTA in May of 2012 showed mild residual narrowing of the descending thoracic aorta measuring less than 30% diameter. Last echocardiogram in May 2012 showed normal LV function. The aortic valve is trileaflet with no stenosis or regurgitation. Since I last saw her in May 2013, the patient denies any dyspnea on exertion, orthopnea, PND, pedal edema, palpitations, syncope or chest pain.   Current Outpatient Prescriptions  Medication Sig Dispense Refill  . fluticasone (FLONASE) 50 MCG/ACT nasal spray Place 2 sprays into both nostrils daily.  16 g  12  . Loratadine (CLARITIN) 10 MG CAPS Take 1 capsule by mouth as needed.       No current facility-administered medications for this visit.     Past Medical History  Diagnosis Date  . Allergic rhinitis     has seen ENT and allergy before  . History of shingles     mild during pregancy 2011  . Aorta coarctation     s/p correction 1983  . Hyperlipidemia   . Heart murmur   . Birth control     Husband's vasectomy  . Palpable abd. aorta      normal Aorta and renal U/S 12-09    Past Surgical History  Procedure Laterality Date  . Breast surgery  1999    Benign nodule removed  . Colon surgery  8182    complication from [facility]  . Cesarean section      x2  . Coarctation of aorta repair  1983  . Dilation and curettage of uterus  2006    History   Social History  . Marital Status: Married    Spouse Name: N/A    Number of Children: 2  . Years of Education: N/A   Occupational History  . [employer]    Social History Main Topics  . Smoking status: Never Smoker   . Smokeless tobacco: Never Used  . Alcohol Use: Yes     Comment: Rare  . Drug Use: No  . Sexual Activity: Not on file   Other Topics Concern  . Not on file    Social History Narrative   P4G2        ROS: no fevers or chills, productive cough, hemoptysis, dysphasia, odynophagia, melena, hematochezia, dysuria, hematuria, rash, seizure activity, orthopnea, PND, pedal edema, claudication. Remaining systems are negative.  Physical Exam: Well-developed well-nourished in no acute distress.  Skin is warm and dry.  HEENT is normal.  Neck is supple.  Chest is clear to auscultation with normal expansion.  Cardiovascular exam is regular rate and rhythm. 2/6 systolic murmur right sternal border. Abdominal exam nontender or distended. No masses palpated. Extremities show no edema. neuro grossly intact  ECG Sinus rhythm at a rate of 70. Cannot rule out prior septal infarct.

## 2013-07-25 NOTE — Patient Instructions (Signed)
Your physician wants you to follow-up in: 2 [place] will receive a reminder letter in the mail two months in advance. If you don't receive a letter, please call our office to schedule the follow-up appointment.   Non-Cardiac CT Angiography (CTA), is a special type of CT scan that uses a computer to produce multi-dimensional views of major blood vessels throughout the body. In CT angiography, a contrast material is injected through an IV to help visualize the blood vessels CTA OF CHEST W AND W/O CONTRAST TO F/U COARCTATION OF AORTA

## 2013-07-25 NOTE — Assessment & Plan Note (Signed)
Previous echocardiogram showed trileaflet aortic valve with no aortic stenosis or insufficiency.

## 2013-07-25 NOTE — Assessment & Plan Note (Signed)
Plan repeat CTA of his thoracic aorta.

## 2013-07-30 ENCOUNTER — Telehealth: Payer: Self-pay | Admitting: Cardiology

## 2013-07-30 ENCOUNTER — Other Ambulatory Visit: Payer: Self-pay | Admitting: *Deleted

## 2013-07-30 DIAGNOSIS — Q251 Coarctation of aorta: Secondary | ICD-10-CM

## 2013-07-30 NOTE — Telephone Encounter (Signed)
Patient has questions about her test being changed. Please call and advise.

## 2013-07-30 NOTE — Telephone Encounter (Signed)
Spoke with pt, aware CTA was changed to a MRA dur to insurance reasons. Patient voiced understanding, instructions of where to go discussed.

## 2013-08-01 ENCOUNTER — Other Ambulatory Visit: Payer: 59

## 2013-08-09 ENCOUNTER — Ambulatory Visit (HOSPITAL_COMMUNITY)
Admission: RE | Admit: 2013-08-09 | Discharge: 2013-08-09 | Disposition: A | Discharge status: Home / Self Care | Payer: 59 | Source: Ambulatory Visit | Attending: Cardiology | Admitting: Cardiology

## 2013-08-09 DIAGNOSIS — Q251 Coarctation of aorta: Secondary | ICD-10-CM | POA: Insufficient documentation

## 2013-08-09 MED ORDER — GADOBENATE DIMEGLUMINE 529 MG/ML IV SOLN
15.0000 mL | Freq: Once | INTRAVENOUS | Status: AC | PRN
  Administered 2013-08-09: 13 mL via INTRAVENOUS

## 2013-12-05 ENCOUNTER — Ambulatory Visit (INDEPENDENT_AMBULATORY_CARE_PROVIDER_SITE_OTHER): Payer: 59 | Admitting: Medical

## 2013-12-05 ENCOUNTER — Encounter: Payer: Self-pay | Admitting: Medical

## 2013-12-05 VITALS — BP 135/60 | HR 75 | Temp 98.4°F | Ht 65.5 in | Wt 133.2 lb

## 2013-12-05 DIAGNOSIS — J3089 Other allergic rhinitis: Secondary | ICD-10-CM

## 2013-12-05 DIAGNOSIS — J208 Acute bronchitis due to other specified organisms: Secondary | ICD-10-CM

## 2013-12-05 DIAGNOSIS — J209 Acute bronchitis, unspecified: Secondary | ICD-10-CM | POA: Insufficient documentation

## 2013-12-05 MED ORDER — AZITHROMYCIN 250 MG PO TABS: ORAL_TABLET | ORAL | Status: DC

## 2013-12-05 MED ORDER — BENZONATATE 100 MG PO CAPS: 100.0000 mg | ORAL_CAPSULE | Freq: Three times a day (TID) | ORAL | Status: DC | PRN

## 2013-12-05 NOTE — Progress Notes (Signed)
Subjective:    Patient ID: [name], female    DOB: 1975/07/19, 38 y.o.   MRN: [id number]  HPI  Pt states she has been feeling sick since Sunday. Just felt little run down with some pnd. Now has a cough and she states little bit productive. Faint sore throat. No sinus or ear pressure. Pt no fever. No body aches. Pt states a lot of coworkers and family have bronchitis. Mild chest congestion.  Pt does not smoke. No history of allergies this time of year. She is on flonase. No wheezing and no history of asthma. LMP- past Friday.  Past Medical History  Diagnosis Date  . Allergic rhinitis     has seen ENT and allergy before  . History of shingles     mild during pregancy 2011  . Aorta coarctation     s/p correction 1983  . Hyperlipidemia   . Heart murmur   . Birth control     Husband's vasectomy  . Palpable abd. aorta      normal Aorta and renal U/S 12-09    History   Social History  . Marital Status: Married    Spouse Name: N/A    Number of Children: 2  . Years of Education: N/A   Occupational History  . [employer]    Social History Main Topics  . Smoking status: Never Smoker   . Smokeless tobacco: Never Used  . Alcohol Use: Yes     Comment: Rare  . Drug Use: No  . Sexual Activity: Not on file   Other Topics Concern  . Not on file   Social History Narrative   P4G2        Past Surgical History  Procedure Laterality Date  . Breast surgery  1999    Benign nodule removed  . Colon surgery  2992    complication from [facility]  . Cesarean section      x2  . Coarctation of aorta repair  1983  . Dilation and curettage of uterus  2006    Family History  Problem Relation Age of Onset  . Hypertension Mother   . Hyperlipidemia Mother   . Hypothyroidism Mother   . Diabetes Neg Hx   . Coronary artery disease Neg Hx   . Stroke Neg Hx   . Colon cancer Neg Hx   . Breast cancer Neg Hx   . Melanoma Father     No Known Allergies  Current Outpatient  Prescriptions on File Prior to Visit  Medication Sig Dispense Refill  . fluticasone (FLONASE) 50 MCG/ACT nasal spray Place 2 sprays into both nostrils daily.  16 g  12  . Loratadine (CLARITIN) 10 MG CAPS Take 1 capsule by mouth as needed.       No current facility-administered medications on file prior to visit.    BP 135/60  Pulse 103  Temp(Src) 98.4 F (36.9 C) (Oral)  Ht 5' 5.5" (1.664 m)  Wt 133 lb 3.2 oz (60.419 kg)  BMI 21.82 kg/m2  SpO2 99%  LMP 11/29/2013     Review of Systems  Constitutional: Negative for fever, chills and fatigue.  HENT: Positive for postnasal drip and sore throat. Negative for congestion, ear pain, nosebleeds, sinus pressure, sneezing and trouble swallowing.        Faint sore throat.  Respiratory: Positive for cough. Negative for chest tightness, shortness of breath and wheezing.        Mild chest congestion.  Cardiovascular: Negative for  chest pain and palpitations.  Musculoskeletal: Negative.   Neurological: Negative.   Hematological: Negative for adenopathy. Does not bruise/bleed easily.       Objective:   Physical Exam  General  Mental Status - Alert. General Appearance - Well groomed. Not in acute distress.  Skin Rashes- No Rashes.  HEENT Head- Normal. Ear Auditory Canal - Left- Normal. Right - Normal.Tympanic Membrane- Left- Normal. Right- Normal. Eye Sclera/Conjunctiva- Left- Normal. Right- Normal. Nose & Sinuses Nasal Mucosa- Left- Boggy + Congested. Right- Boggy+ Congested. No sinus pressure. Mouth & Throat Lips: Upper Lip- Normal: no dryness, cracking, pallor, cyanosis, or vesicular eruption. Lower Lip-Normal: no dryness, cracking, pallor, cyanosis or vesicular eruption. Buccal Mucosa- Bilateral- No Aphthous ulcers. Oropharynx- No Discharge or Erythema. PND. Tonsils: Characteristics- Bilateral- No Erythema or Congestion. Size/Enlargement- Bilateral- No enlargement. Discharge- bilateral-None.  Neck Neck- Supple. No Masses.  NO lymphadenopathy.   Chest and Lung Exam Auscultation: Breath Sounds:-Normal. Clear, even and unlabored.  Cardiovascular Auscultation:Rythm- Regular, rate and rhythm. Murmurs & Other Heart Sounds:Ausculatation of the heart reveal- No Murmurs.  Lymphatic Head & Neck General Head & Neck Lymphatics: Bilateral: Description- No Localized lymphadenopathy.        Assessment & Plan:

## 2013-12-05 NOTE — Assessment & Plan Note (Addendum)
Possible and early. But approaching the weekend after 45days of onset. Some productive cough. If worsens over weekend despite fluticasone and benzonatate then start azithromycin.

## 2013-12-05 NOTE — Patient Instructions (Signed)
Your recent symptoms may represent flareup of allergies versus upper respiratory infection or  bronchitis. I want you to continue your fluticasone nasal spray and I am prescribing benzonatate for your cough. Since we are approaching the weekend and your symptoms are present for almost one week, I am making Zithromax  available for worsening symptoms   indicating bronchitis.  Followup in 7 days or when necessary for worsening or persisting signs and symptoms.

## 2013-12-05 NOTE — Assessment & Plan Note (Signed)
Continue fluticasone  

## 2014-02-10 ENCOUNTER — Other Ambulatory Visit: Payer: Self-pay | Admitting: Obstetrics and Gynecology

## 2014-02-11 LAB — CYTOLOGY - PAP

## 2014-03-07 DIAGNOSIS — D239 Other benign neoplasm of skin, unspecified: Secondary | ICD-10-CM

## 2014-03-07 HISTORY — DX: Other benign neoplasm of skin, unspecified: D23.9

## 2014-06-11 ENCOUNTER — Other Ambulatory Visit: Payer: Self-pay

## 2014-06-19 ENCOUNTER — Telehealth: Payer: Self-pay | Admitting: Internal Medicine

## 2014-06-19 NOTE — Telephone Encounter (Signed)
Pre Visit Letter Sent °

## 2014-06-26 ENCOUNTER — Encounter: Payer: Self-pay | Admitting: Internal Medicine

## 2014-06-26 ENCOUNTER — Ambulatory Visit (INDEPENDENT_AMBULATORY_CARE_PROVIDER_SITE_OTHER): Payer: 59 | Admitting: Internal Medicine

## 2014-06-26 VITALS — BP 126/74 | HR 70 | Temp 97.7°F | Wt 135.1 lb

## 2014-06-26 DIAGNOSIS — J069 Acute upper respiratory infection, unspecified: Secondary | ICD-10-CM

## 2014-06-26 DIAGNOSIS — R059 Cough, unspecified: Secondary | ICD-10-CM

## 2014-06-26 DIAGNOSIS — R05 Cough: Secondary | ICD-10-CM

## 2014-06-26 MED ORDER — AZELASTINE HCL 0.1 % NA SOLN: 2.0000 | Freq: Every evening | NASAL | Status: DC | PRN

## 2014-06-26 NOTE — Progress Notes (Signed)
Pre visit review using our clinic review tool, if applicable. No additional management support is needed unless otherwise documented below in the visit note. 

## 2014-06-26 NOTE — Patient Instructions (Signed)
Rest, fluids , tylenol Take Mucinex DM twice a day as needed  Continue  Flonase Add Astelin at bedtime Call if not gradually better over the next  10 days Call anytime if the symptoms are severe t pain

## 2014-06-26 NOTE — Progress Notes (Signed)
Subjective:    Patient ID: KYM SCANNELL, female    DOB: Jan 16, 1976, 39 y.o.   MRN: [id number]  DOS:  06/26/2014 Type of visit - description : acute Interval history: 2 weeks history of nasal congestion, initially she felt they were allergies. 2 days ago developed cough, yesterday  Face congestion  Increased  with some pressure. Her daughter was diagnosed with the flu 3 days ago patient is concerned about it. Currently taking Flonase and Claritin   Review of Systems Denies fever chills. Some runny nose, no sore throat No nausea, vomiting, myalgias. + Malaise/fatigue Some sputum in the morning, color? Denies itchy eyes or itchy nose, + sneezing  Past Medical History  Diagnosis Date  . Allergic rhinitis     has seen ENT and allergy before  . History of shingles     mild during pregancy 2011  . Aorta coarctation     s/p correction 1983  . Hyperlipidemia   . Heart murmur   . Birth control     Husband's vasectomy  . Palpable abd. aorta      normal Aorta and renal U/S 12-09    Past Surgical History  Procedure Laterality Date  . Breast surgery  1999    Benign nodule removed  . Colon surgery  7106    complication from [facility]  . Cesarean section      x2  . Coarctation of aorta repair  1983  . Dilation and curettage of uterus  2006    History   Social History  . Marital Status: Married    Spouse Name: N/A  . Number of Children: 2  . Years of Education: N/A   Occupational History  . [employer]    Social History Main Topics  . Smoking status: Never Smoker   . Smokeless tobacco: Never Used  . Alcohol Use: Yes     Comment: Rare  . Drug Use: No  . Sexual Activity: Not on file   Other Topics Concern  . Not on file   Social History Narrative   P4G2            Medication List       This list is accurate as of: 06/26/14 11:59 PM.  Always use your most recent med list.               azelastine 0.1 % nasal spray  Commonly known as:  ASTELIN  Place 2  sprays into both nostrils at bedtime as needed for rhinitis. Use in each nostril as directed     CLARITIN 10 MG Caps  Generic drug:  Loratadine  Take 1 capsule by mouth as needed.     fluticasone 50 MCG/ACT nasal spray  Commonly known as:  FLONASE  Place 2 sprays into both nostrils daily.           Objective:   Physical Exam BP 126/74 mmHg  Pulse 70  Temp(Src) 97.7 F (36.5 C) (Oral)  Wt 135 lb 2 oz (61.292 kg)  SpO2 98%  LMP 06/20/2014 (Exact Date) General:   Well developed, well nourished . NAD.  HEENT:  Normocephalic . Face symmetric, atraumatic; nose seems congested, maxilary sinuses slightly TTP bilaterally. Frontal sinuses normal  Eyes: EOMI Conjunctiva: No redness or discharge   Ears: Right ear: Canal normal, TM normal Left ear: Canal normal, TM normal Throat-mouth:  no redness, discharge. Uvula midline. Lungs:  CTA B Normal respiratory effort, no intercostal retractions, no accessory muscle use. Heart: RRR,  Soft syst murmur.  Muscle skeletal: no pretibial edema bilaterally  Skin: Not pale. Not jaundice Neurologic:  alert & oriented X3.  Speech normal, gait appropriate for age and unassisted Psych--  Cognition and judgment appear intact.  Cooperative with normal attention span and concentration.  Behavior appropriate. No anxious or depressed appearing.       Assessment & Plan:    URI versus allergies Hard to say if she has a URI  allergies or both. Likes to be tested for the flu, test came back neg  Plan: Add Astelin, see instructions If not better in a few days, consider antibiotics

## 2014-07-07 ENCOUNTER — Telehealth: Payer: Self-pay | Admitting: *Deleted

## 2014-07-07 ENCOUNTER — Encounter: Payer: Self-pay | Admitting: *Deleted

## 2014-07-07 NOTE — Telephone Encounter (Signed)
Unable to reach patient at time of Pre-Visit Call.  Left message for patient to return call when available.    

## 2014-07-07 NOTE — Telephone Encounter (Signed)
Pre-Visit Call completed with patient and chart updated.   Pre-Visit Info documented in Specialty Comments under [employer].    

## 2014-07-07 NOTE — Addendum Note (Signed)
Addended by: Leticia Penna A on: 07/07/2014 05:03 PM   Modules accepted: Medications

## 2014-07-08 ENCOUNTER — Ambulatory Visit (INDEPENDENT_AMBULATORY_CARE_PROVIDER_SITE_OTHER): Payer: 59 | Admitting: Internal Medicine

## 2014-07-08 ENCOUNTER — Encounter: Payer: Self-pay | Admitting: Internal Medicine

## 2014-07-08 VITALS — BP 118/64 | HR 84 | Temp 98.0°F | Ht 66.0 in | Wt 134.0 lb

## 2014-07-08 DIAGNOSIS — Z0000 Encounter for general adult medical examination without abnormal findings: Secondary | ICD-10-CM

## 2014-07-08 LAB — CBC WITH DIFFERENTIAL/PLATELET
BASOS ABS: 0 10*3/uL (ref 0.0–0.1)
Basophils Relative: 0.6 % (ref 0.0–3.0)
Eosinophils Absolute: 0.2 10*3/uL (ref 0.0–0.7)
Eosinophils Relative: 2.4 % (ref 0.0–5.0)
HCT: 44.2 % (ref 36.0–46.0)
Hemoglobin: 15.2 g/dL — ABNORMAL HIGH (ref 12.0–15.0)
LYMPHS PCT: 34 % (ref 12.0–46.0)
Lymphs Abs: 2.2 10*3/uL (ref 0.7–4.0)
MCHC: 34.3 g/dL (ref 30.0–36.0)
MCV: 83.1 fl (ref 78.0–100.0)
Monocytes Absolute: 0.3 10*3/uL (ref 0.1–1.0)
Monocytes Relative: 4 % (ref 3.0–12.0)
NEUTROS PCT: 59 % (ref 43.0–77.0)
Neutro Abs: 3.7 10*3/uL (ref 1.4–7.7)
Platelets: 326 10*3/uL (ref 150.0–400.0)
RBC: 5.32 Mil/uL — AB (ref 3.87–5.11)
RDW: 13.7 % (ref 11.5–15.5)
WBC: 6.4 10*3/uL (ref 4.0–10.5)

## 2014-07-08 LAB — BASIC METABOLIC PANEL
BUN: 9 mg/dL (ref 6–23)
CHLORIDE: 101 meq/L (ref 96–112)
CO2: 26 mEq/L (ref 19–32)
CREATININE: 0.94 mg/dL (ref 0.40–1.20)
Calcium: 10.1 mg/dL (ref 8.4–10.5)
GFR: 69.93 mL/min (ref >60.00)
GLUCOSE: 78 mg/dL (ref 70–99)
POTASSIUM: 3.9 meq/L (ref 3.5–5.1)
Sodium: 136 mEq/L (ref 135–145)

## 2014-07-08 LAB — LIPID PANEL
CHOLESTEROL: 207 mg/dL — AB (ref 0–200)
HDL: 69.2 mg/dL (ref >39.00)
LDL CALC: 126 mg/dL — AB (ref 0–99)
NonHDL: 137.8
Total CHOL/HDL Ratio: 3
Triglycerides: 58 mg/dL (ref 0.0–149.0)
VLDL: 11.6 mg/dL (ref 0.0–40.0)

## 2014-07-08 MED ORDER — FLUTICASONE PROPIONATE 50 MCG/ACT NA SUSP: 2.0000 | Freq: Every day | NASAL | Status: DC

## 2014-07-08 NOTE — Assessment & Plan Note (Signed)
Td-2013 sees gyn (Dr Helane Rima) ,  Had a  MMG 02-2014 never had a Cscope  Doing great w/ exercise-diet .  Labs FH melanoma,   sees derm ~ 1 year Saw cards 2015

## 2014-07-08 NOTE — Patient Instructions (Signed)
Get your blood work before you leave     Come back to the office in 1 year   for a physical exam  Please schedule an appointment at the front desk    Come back fasting

## 2014-07-08 NOTE — Progress Notes (Signed)
Pre visit review using our clinic review tool, if applicable. No additional management support is needed unless otherwise documented below in the visit note. 

## 2014-07-08 NOTE — Progress Notes (Signed)
Subjective:    Patient ID: [name], female    DOB: 08/14/1975, 39 y.o.   MRN: [id number]  DOS:  07/08/2014 Type of visit - description : cpx Interval history: Was seen with URI last week, now doing better    Review of Systems Constitutional: No fever, chills. No unexplained wt changes. No unusual sweats HEENT: No dental problems, ear discharge, facial swelling, voice changes. No eye discharge, redness or intolerance to light Respiratory: No wheezing or difficulty breathing. No cough , mucus production Cardiovascular: No CP, leg swelling or palpitations GI: no nausea, vomiting, diarrhea or abdominal pain.  No blood in the stools. No dysphagia   Endocrine: No polyphagia, polyuria or polydipsia GU: No dysuria, gross hematuria, difficulty urinating. No urinary urgency or frequency. Musculoskeletal: No joint swellings or unusual aches or pains Skin: No change in the color of the skin, palor or rash Allergic, immunologic: No environmental allergies or food allergies Neurological: No dizziness or syncope. No headaches. No diplopia, slurred speech, motor deficits, facial numbness Hematological: No enlarged lymph nodes, easy bruising or bleeding Psychiatry: No suicidal ideas, hallucinations, behavior problems or confusion. No unusual/severe anxiety or depression.     Past Medical History  Diagnosis Date  . Allergic rhinitis     has seen ENT and allergy before  . History of shingles     mild during pregancy 2011  . Aorta coarctation     s/p correction 1983  . Hyperlipidemia   . Heart murmur   . Birth control     Husband's vasectomy  . Palpable abd. aorta      normal Aorta and renal U/S 12-09    Past Surgical History  Procedure Laterality Date  . Breast surgery  1999    Benign nodule removed  . Colon surgery  2536    complication from [facility]  . Cesarean section      x2  . Coarctation of aorta repair  1983  . Dilation and curettage of uterus  2006    History   Social  History  . Marital Status: Married    Spouse Name: N/A  . Number of Children: 2  . 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 Smoking status: Never Smoker   . Smokeless tobacco: Never Used  . Alcohol Use: Yes     Comment: Rare  . Drug Use: No  . Sexual Activity: Not on file   Other Topics Concern  . Not on file   Social History Narrative   P4G2         Family History  Problem Relation Age of Onset  . Hypertension Mother   . Hyperlipidemia Mother   . Hypothyroidism Mother   . Diabetes Neg Hx   . Coronary artery disease Neg Hx   . Stroke Neg Hx   . Colon cancer Neg Hx   . Breast cancer Neg Hx   . Melanoma Father        Medication List       This list is accurate as of: 07/08/14  5:15 PM.  Always use your most recent med list.               azelastine 0.1 % nasal spray  Commonly known as:  ASTELIN  Place 2 sprays into both nostrils at bedtime as needed for rhinitis. Use in each nostril as directed     CLARITIN 10 MG Caps  Generic drug:  Loratadine  Take 1 capsule by mouth as needed.     fluticasone 50 MCG/ACT nasal spray  Commonly known as:  FLONASE  Place 2 sprays into both nostrils daily.           Objective:   Physical Exam BP 118/64 mmHg  Pulse 84  Temp(Src) 98 F (36.7 C) (Oral)  Ht 5\' 6"  (1.676 m)  Wt 134 lb (60.782 kg)  BMI 21.64 kg/m2  SpO2 98%  LMP 06/20/2014 (Exact Date)  General:   Well developed, well nourished . NAD.  Neck:  Full range of motion. Supple. No  thyromegaly , normal carotid pulse HEENT:  Normocephalic . Face symmetric, atraumatic Lungs:  CTA B Normal respiratory effort, no intercostal retractions, no accessory muscle use. Heart: RRR,  + syst  Murmur II/IV.  Abdomen:  Not distended, soft, non-tender. No rebound or rigidity. No mass,organomegaly Muscle skeletal: no pretibial edema bilaterally  Skin: Exposed areas without rash. Not pale. Not  jaundice Neurologic:  alert & oriented X3.  Speech normal, gait appropriate for age and unassisted Strength symmetric and appropriate for age.  Psych: Cognition and judgment appear intact.  Cooperative with normal attention span and concentration.  Behavior appropriate. No anxious or depressed appearing.       Assessment & Plan:

## 2014-08-08 ENCOUNTER — Ambulatory Visit (INDEPENDENT_AMBULATORY_CARE_PROVIDER_SITE_OTHER): Payer: 59 | Admitting: Medical

## 2014-08-08 ENCOUNTER — Encounter: Payer: Self-pay | Admitting: Medical

## 2014-08-08 VITALS — BP 128/90 | HR 77 | Temp 98.0°F | Ht 66.0 in | Wt 133.0 lb

## 2014-08-08 DIAGNOSIS — J0100 Acute maxillary sinusitis, unspecified: Secondary | ICD-10-CM | POA: Diagnosis not present

## 2014-08-08 MED ORDER — CEFDINIR 300 MG PO CAPS: 300.0000 mg | ORAL_CAPSULE | Freq: Two times a day (BID) | ORAL | Status: DC

## 2014-08-08 NOTE — Progress Notes (Signed)
Subjective:    Patient ID: [name], female    DOB: [birth year], 39 y.o.   MRN: [id number]  HPI  Pt in with nasal congestion and some chest congestion since last wed. Just recently started to cough up some mucous. On Saturday felt mildly feverish and subjective. Pt had some sneezing and pnd.pt has allergies and flonase, claritin and astelin.  LMP- Jul 16, 2014.   Review of Systems  Constitutional: Negative for fever, chills and fatigue.       Fever transient on Saturday(subjective)  HENT: Positive for congestion, sinus pressure and sneezing. Negative for sore throat.        Ear pressure.  Respiratory: Positive for cough. Negative for shortness of breath and wheezing.   Cardiovascular: Negative for chest pain and palpitations.  Gastrointestinal: Negative.   Musculoskeletal: Negative for back pain.  Neurological: Negative for dizziness, numbness and headaches.  Hematological: Negative for adenopathy. Does not bruise/bleed easily.  Psychiatric/Behavioral: Negative for behavioral problems and confusion.   Past Medical History  Diagnosis Date  . Allergic rhinitis     has seen ENT and allergy before  . History of shingles     mild during pregancy 2011  . Aorta coarctation     s/p correction 1983  . Hyperlipidemia   . Heart murmur   . Birth control     Husband's vasectomy  . Palpable abd. aorta      normal Aorta and renal U/S 12-09    History   Social History  . Marital Status: Married    Spouse Name: N/A  . Number of Children: 2  . 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 Smoking status: Never Smoker   . Smokeless tobacco: Never Used  . Alcohol Use: Yes     Comment: Rare  . Drug Use: No  . Sexual Activity: Not on file   Other Topics Concern  . Not on file   Social History Narrative   P4G2        Past Surgical History  Procedure Laterality Date  . Breast surgery  1999    Benign nodule removed    . Colon surgery  [id number]    complication from [facility]  . Cesarean section      x2  . Coarctation of aorta repair  1983  . Dilation and curettage of uterus  2006    Family History  Problem Relation Age of Onset  . Hypertension Mother   . Hyperlipidemia Mother   . Hypothyroidism Mother   . Diabetes Neg Hx   . Coronary artery disease Neg Hx   . Stroke Neg Hx   . Colon cancer Neg Hx   . Breast cancer Neg Hx   . Melanoma Father     No Known Allergies  Current Outpatient Prescriptions on File Prior to Visit  Medication Sig Dispense Refill  . azelastine (ASTELIN) 0.1 % nasal spray Place 2 sprays into both nostrils at bedtime as needed for rhinitis. Use in each nostril as directed 30 mL 3  . fluticasone (FLONASE) 50 MCG/ACT nasal spray Place 2 sprays into both nostrils daily. 16 g 12  . Loratadine (CLARITIN) 10 MG CAPS Take 1 capsule by mouth as needed.     No current facility-administered medications on file prior to visit.    BP 128/90 mmHg  Pulse 77  Temp(Src) 98 F (36.7 C) (Oral)  Ht 5\' 6"  (1.676 m)  Wt 133 lb (60.328 kg)  BMI 21.48 kg/m2  SpO2 100%  LMP 07/17/2014       Objective:   Physical Exam  General  Mental Status - Alert. General Appearance - Well groomed. Not in acute distress.  Skin Rashes- No Rashes.  HEENT Head- Normal. Ear Auditory Canal - Left- Normal. Right - Normal.Tympanic Membrane- Left- Normal. Right- Normal. Eye Sclera/Conjunctiva- Left- Normal. Right- Normal. Nose & Sinuses Nasal Mucosa- Left-  Boggy and Congested. Right-  Boggy and  Congested.Bilateral maxillary pressure but no  frontal sinus pressure. Mouth & Throat Lips: Upper Lip- Normal: no dryness, cracking, pallor, cyanosis, or vesicular eruption. Lower Lip-Normal: no dryness, cracking, pallor, cyanosis or vesicular eruption. Buccal Mucosa- Bilateral- No Aphthous ulcers. Oropharynx- No Discharge or Erythema. +pnd Tonsils: Characteristics- Bilateral- No Erythema or Congestion.  Size/Enlargement- Bilateral- No enlargement. Discharge- bilateral-None.  Neck Neck- Supple. No Masses.   Chest and Lung Exam Auscultation: Breath Sounds:-Clear even and unlabored.  Cardiovascular Auscultation:Rythm- Regular, rate and rhythm. Murmurs & Other Heart Sounds:Ausculatation of the heart reveal- No Murmurs.  Lymphatic Head & Neck General Head & Neck Lymphatics: Bilateral: Description- No Localized lymphadenopathy.       Assessment & Plan:

## 2014-08-08 NOTE — Patient Instructions (Signed)
Sinusitis, acute maxillary Rx cefdnir. Continue astelin, flonase and claritin. Infection likely related to moderate/severe nasal congestion.   Use your benzonatate for cough.  If your nasal congestion not improved by early next week could consider low dose depomedrol.  If you have fever and severe sinus pain could also give IM antibitoic.  Follow up in 7 days or as needed.

## 2014-08-08 NOTE — Assessment & Plan Note (Signed)
Rx cefdnir. Continue astelin, flonase and claritin. Infection likely related to moderate/severe nasal congestion.   Use your benzonatate for cough.  If your nasal congestion not improved by early next week could consider low dose depomedrol.  If you have fever and severe sinus pain could also give IM antibitoic.  Follow up in 7 days or as needed.

## 2014-08-08 NOTE — Progress Notes (Signed)
Pre visit review using our clinic review tool, if applicable. No additional management support is needed unless otherwise documented below in the visit note. 

## 2014-09-02 ENCOUNTER — Encounter: Payer: Self-pay | Admitting: Internal Medicine

## 2015-07-10 ENCOUNTER — Encounter: Payer: Self-pay | Admitting: Internal Medicine

## 2015-07-10 ENCOUNTER — Ambulatory Visit (INDEPENDENT_AMBULATORY_CARE_PROVIDER_SITE_OTHER): Payer: 59 | Admitting: Internal Medicine

## 2015-07-10 VITALS — BP 108/62 | HR 71 | Temp 98.2°F | Ht 66.0 in | Wt 129.8 lb

## 2015-07-10 DIAGNOSIS — Z09 Encounter for follow-up examination after completed treatment for conditions other than malignant neoplasm: Secondary | ICD-10-CM | POA: Insufficient documentation

## 2015-07-10 DIAGNOSIS — Z0000 Encounter for general adult medical examination without abnormal findings: Secondary | ICD-10-CM

## 2015-07-10 LAB — HEPATIC FUNCTION PANEL
ALBUMIN: 4.8 g/dL (ref 3.5–5.2)
ALK PHOS: 40 U/L (ref 39–117)
ALT: 16 U/L (ref 0–35)
AST: 18 U/L (ref 0–37)
BILIRUBIN DIRECT: 0.1 mg/dL (ref 0.0–0.3)
Total Bilirubin: 0.7 mg/dL (ref 0.2–1.2)
Total Protein: 7.3 g/dL (ref 6.0–8.3)

## 2015-07-10 LAB — LIPID PANEL
CHOL/HDL RATIO: 3
Cholesterol: 192 mg/dL (ref 0–200)
HDL: 68.8 mg/dL (ref >39.00)
LDL Cholesterol: 117 mg/dL — ABNORMAL HIGH (ref 0–99)
NONHDL: 123.63
Triglycerides: 32 mg/dL (ref 0.0–149.0)
VLDL: 6.4 mg/dL (ref 0.0–40.0)

## 2015-07-10 MED ORDER — AZELASTINE HCL 0.1 % NA SOLN: 2.0000 | Freq: Every evening | NASAL | Status: DC | PRN

## 2015-07-10 MED ORDER — FLUTICASONE PROPIONATE 50 MCG/ACT NA SUSP: 2.0000 | Freq: Every day | NASAL | Status: DC

## 2015-07-10 NOTE — Assessment & Plan Note (Signed)
Hyperlipidemia: On diet control, check labs Cardiovascular: Saw Dr. Stanford Breed 2015, MRA of the chest satisfactory, patient plans to see him next year Environmental allergies: Well control with astelin- Flonase, refills sent RTC one year

## 2015-07-10 NOTE — Assessment & Plan Note (Addendum)
Td-2013 Female care per  gyn (Dr Helane Rima)  never had a Cscope  Doing great w/ exercise-diet .  Labs reviewed with the patient, will check a FLP and LFTs

## 2015-07-10 NOTE — Patient Instructions (Signed)
GO TO THE LAB :      Get the blood work     GO TO THE FRONT DESK Schedule your next appointment for a  general check up in 1 year

## 2015-07-10 NOTE — Progress Notes (Signed)
Subjective:    Patient ID: [name], female    DOB: [birth year], 40 y.o.   MRN: [id number]  DOS:  07/10/2015 Type of visit - description : CPX Interval history: No concerns, sees dermatology regularly   Review of Systems Constitutional: No fever. No chills. No unexplained wt changes. No unusual sweats  HEENT: No dental problems, no ear discharge, no facial swelling, no voice changes. No eye discharge, no eye  redness , no  intolerance to light   Respiratory: No wheezing , no  difficulty breathing. No cough , no mucus production  Cardiovascular: No CP, no leg swelling , no  Palpitations  GI: no nausea, no vomiting, no diarrhea , no  abdominal pain.  No blood in the stools. No dysphagia, no odynophagia    Endocrine: No polyphagia, no polyuria , no polydipsia  GU: No dysuria, gross hematuria, difficulty urinating. No urinary urgency, no frequency.  Musculoskeletal: No joint swellings or unusual aches or pains  Skin: No change in the color of the skin, palor , no  Rash  Allergic, immunologic: + environmental allergies , no  food allergies  Neurological: No dizziness no  syncope. No headaches. No diplopia, no slurred, no slurred speech, no motor deficits, no facial  Numbness  Hematological: No enlarged lymph nodes, no easy bruising , no unusual bleedings  Psychiatry: No suicidal ideas, no hallucinations, no beavior problems, no confusion.  No unusual/severe anxiety, no depression   Past Medical History  Diagnosis Date  . Allergic rhinitis     has seen ENT and allergy before  . History of shingles     mild during pregancy 2011  . Aorta coarctation     s/p correction 1983  . Hyperlipidemia   . Heart murmur   . Birth control     Husband's vasectomy  . Palpable abd. aorta      normal Aorta and renal U/S 12-09  . Dysplastic nevus 2016    midline inferior upper back    Past Surgical History  Procedure Laterality Date  . Breast surgery  1999    Benign nodule removed   . Colon surgery  [id number]    complication from [facility]  . Cesarean section      x2  . Coarctation of aorta repair  1983  . Dilation and curettage of uterus  2006    Social History   Social History  . Marital Status: Married    Spouse Name: N/A  . Number of Children: 2  . 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 Smoking status: Never Smoker   . Smokeless tobacco: Never Used  . Alcohol Use: Yes     Comment: Rare  . Drug Use: No  . Sexual Activity: Not on file   Other Topics Concern  . Not on file   Social History Narrative   P4G2   2 children 2008, 2011     Family History  Problem Relation Age of Onset  . Hypertension Mother   . Hyperlipidemia Mother   . Hypothyroidism Mother   . Diabetes Neg Hx   . Coronary artery disease Neg Hx   . Stroke Neg Hx   . Colon cancer Neg Hx   . Breast cancer Neg Hx   . Melanoma Father       Medication List       This list is accurate as of: 07/10/15  2:10 PM.  Always use your most recent med list.               azelastine 0.1 % nasal spray  Commonly known as:  ASTELIN  Place 2 sprays into both nostrils at bedtime as needed for rhinitis. Use in each nostril as directed     buPROPion 150 MG 12 hr tablet  Commonly known as:  WELLBUTRIN SR  Take 1 tablet by mouth daily.     fluticasone 50 MCG/ACT nasal spray  Commonly known as:  FLONASE  Place 2 sprays into both nostrils daily.           Objective:   Physical Exam BP 108/62 mmHg  Pulse 71  Temp(Src) 98.2 F (36.8 C) (Oral)  Ht 5\' 6"  (1.676 m)  Wt 129 lb 12.8 oz (58.877 kg)  BMI 20.96 kg/m2  SpO2 99%  LMP 06/17/2015  General:   Well developed, well nourished . NAD.  Neck: No  Thyromegaly  HEENT:  Normocephalic . Face symmetric, atraumatic Lungs:  CTA B Normal respiratory effort, no intercostal retractions, no accessory muscle use. Heart: RRR, + Systolic murmur  murmur.  No pretibial edema bilaterally    Abdomen:  Not distended, soft, non-tender. No rebound or rigidity.   Palpable nontender aorta. No bruit Skin: Exposed areas without rash. Not pale. Not jaundice Neurologic:  alert & oriented X3.  Speech normal, gait appropriate for age and unassisted Strength symmetric and appropriate for age.  Psych: Cognition and judgment appear intact.  Cooperative with normal attention span and concentration.  Behavior appropriate. No anxious or depressed appearing.    Assessment & Plan:   Assessment Hyperlipidemia Heart murmur Palpable aorta-- normal renal-aorta US 2009  Dysplastic nevus 2016, +FH melanoma-- sees derm q year Birth-control -- husband's  vasectomy  H/o Shingles 2011 Aorta coarctation, surgery 1983  Plan: Hyperlipidemia: On diet control, check labs Cardiovascular: Saw Dr. Stanford Breed 2015, MRA of the chest satisfactory, patient plans to see him next year Environmental allergies: Well control with astelin- Flonase, refills sent RTC one year

## 2015-07-10 NOTE — Progress Notes (Signed)
Pre visit review using our clinic review tool, if applicable. No additional management support is needed unless otherwise documented below in the visit note. 

## 2015-12-29 ENCOUNTER — Ambulatory Visit (INDEPENDENT_AMBULATORY_CARE_PROVIDER_SITE_OTHER): Payer: 59 | Admitting: Internal Medicine

## 2015-12-29 ENCOUNTER — Encounter: Payer: Self-pay | Admitting: Internal Medicine

## 2015-12-29 VITALS — BP 112/78 | HR 70 | Temp 98.1°F | Resp 14 | Ht 66.0 in | Wt 136.4 lb

## 2015-12-29 DIAGNOSIS — J069 Acute upper respiratory infection, unspecified: Secondary | ICD-10-CM

## 2015-12-29 MED ORDER — AZITHROMYCIN 250 MG PO TABS: ORAL_TABLET | ORAL | 0 refills | Status: DC

## 2015-12-29 NOTE — Assessment & Plan Note (Signed)
URI/bronchitis: Respiratory sx for few days, sx started shortly after she had a flu shot but also she has been around children with different URIs. Recommend continue with conservative treatment but if she is not better in few days okay to start a Z-Pak.

## 2015-12-29 NOTE — Progress Notes (Signed)
Subjective:    Patient ID: [name], female    DOB: May 19, 1975, 40 y.o.   MRN: [id number]  DOS:  12/29/2015 Type of visit - description : Acute visit Interval history: Had a flu shot about 10-12 days ago, since then she is not feeling well and symptoms are getting slightly worse. Cough, chest congestion, unable to bring up any sputum. Denies sore throat, fever or chills. She did have some muscle aches the first 2 days of the symptoms. Taking Mucinex twice a day  Review of Systems Denies nausea vomiting No headache or rash  Past Medical History:  Diagnosis Date  . Allergic rhinitis    has seen ENT and allergy before  . Aorta coarctation    s/p correction 1983  . Birth control    Husband's vasectomy  . Dysplastic nevus 2016   midline inferior upper back  . Heart murmur   . History of shingles    mild during pregancy 2011  . Hyperlipidemia   . Palpable abd. aorta     normal Aorta and renal U/S 12-09    Past Surgical History:  Procedure Laterality Date  . BREAST SURGERY  1999   Benign nodule removed  . CESAREAN SECTION     x2  . COARCTATION OF AORTA REPAIR  1983  . COLON SURGERY  [id number]   complication from [facility]  . [place] OF UTERUS  2006    Social History   Social History  . Marital status: Married    Spouse name: N/A  . Number of children: 2  . 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 Smoking status: Never Smoker  . Smokeless tobacco: Never Used  . Alcohol use Yes     Comment: Rare  . Drug use: No  . Sexual activity: Not on file   Other Topics Concern  . Not on file   Social History Narrative   P4G2   2 children 2008, 2011        Medication List       Accurate as of 12/29/15  5:11 PM. Always use your most recent med list.          azelastine 0.1 % nasal spray Commonly known as:  ASTELIN Place 2 sprays into both nostrils at bedtime as needed for  rhinitis. Use in each nostril as directed   azithromycin 250 MG tablet Commonly known as:  ZITHROMAX Z-PAK 2 tabs a day the first day, then 1 tab a day x 4 days   buPROPion 150 MG 12 hr tablet Commonly known as:  WELLBUTRIN SR Take 1 tablet by mouth daily.   fluticasone 50 MCG/ACT nasal spray Commonly known as:  FLONASE Place 2 sprays into both nostrils daily.          Objective:   Physical Exam BP 112/78 (BP Location: Left Arm, Patient Position: Sitting, Cuff Size: Small)   Pulse 70   Temp 98.1 F (36.7 C) (Oral)   Resp 14   Ht 5\' 6"  (1.676 m)   Wt 136 lb 6 oz (61.9 kg)   LMP 12/26/2015 (Exact Date)   SpO2 98%   BMI 22.01 kg/m  General:   Well developed, well nourished . NAD.  HEENT:  Normocephalic . Face symmetric, atraumatic. TMs normal, throat symmetric, no white patches. Nose is slightly congested. Lungs:  Very few rhonchi that clear with cough otherwise clear to auscultation  Normal respiratory effort, no intercostal retractions, no accessory muscle use. Heart: RRR, + systolic murmur.  No pretibial edema bilaterally  Skin: Not pale. Not jaundice Neurologic:  alert & oriented X3.  Speech normal, gait appropriate for age and unassisted Psych--  Cognition and judgment appear intact.  Cooperative with normal attention span and concentration.  Behavior appropriate. No anxious or depressed appearing.      Assessment & Plan:  Assessment Hyperlipidemia Heart murmur Palpable aorta-- normal renal-aorta US 2009  Dysplastic nevus 2016, +FH melanoma-- sees derm q year Birth-control -- husband's  vasectomy  H/o Shingles 2011 Aorta coarctation, surgery 1983  PLAN: URI/bronchitis: Respiratory sx for few days, sx started shortly after she had a flu shot but also she has been around children with different URIs. Recommend continue with conservative treatment but if she is not better in few days okay to start a Z-Pak.

## 2015-12-29 NOTE — Progress Notes (Signed)
Pre visit review using our clinic review tool, if applicable. No additional management support is needed unless otherwise documented below in the visit note. 

## 2015-12-29 NOTE — Patient Instructions (Signed)
Rest, fluids , tylenol  For cough:  Take Mucinex DM twice a day as needed until better  For nasal congestion: Use OTC Nasocort or Flonase : 2 nasal sprays on each side of the nose in the morning until you feel better Use ASTELIN a prescribed spray : 2 nasal sprays on each side of the nose at night until you feel better  Get pseudoephedrine 30 mg (behind the counter, you need to talk with the pharmacist) take one tablet 3 or 4 times a day as needed for congestion  Take the antibiotic as prescribed  (zithromax)  Only if no better in few days   Call if not gradually better over the next  10 days  Call anytime if the symptoms are severe

## 2016-05-05 ENCOUNTER — Encounter: Payer: Self-pay | Admitting: Family Medicine

## 2016-05-05 ENCOUNTER — Ambulatory Visit (INDEPENDENT_AMBULATORY_CARE_PROVIDER_SITE_OTHER): Payer: 59 | Admitting: Family Medicine

## 2016-05-05 VITALS — BP 131/86 | HR 75 | Temp 98.4°F | Ht 66.0 in | Wt 139.8 lb

## 2016-05-05 DIAGNOSIS — J208 Acute bronchitis due to other specified organisms: Secondary | ICD-10-CM | POA: Diagnosis not present

## 2016-05-05 DIAGNOSIS — B9689 Other specified bacterial agents as the cause of diseases classified elsewhere: Secondary | ICD-10-CM | POA: Diagnosis not present

## 2016-05-05 MED ORDER — AZITHROMYCIN 250 MG PO TABS: ORAL_TABLET | ORAL | 0 refills | Status: DC

## 2016-05-05 MED ORDER — BENZONATATE 100 MG PO CAPS: 100.0000 mg | ORAL_CAPSULE | Freq: Three times a day (TID) | ORAL | 0 refills | Status: DC | PRN

## 2016-05-05 NOTE — Progress Notes (Signed)
Pre visit review using our clinic review tool, if applicable. No additional management support is needed unless otherwise documented below in the visit note. 

## 2016-05-05 NOTE — Patient Instructions (Addendum)
Continue to push fluids, practice good hand hygiene, and cover your mouth if you cough.  If you start having sustained and high fevers, shaking or shortness of breath, seek immediate care.  Wait 2-3 days prior to taking antibiotic (Azithromycin).

## 2016-05-05 NOTE — Progress Notes (Addendum)
Chief Complaint  Patient presents with  . Cough  . Fever    [name] here for URI complaints.  Duration: 5 days  Associated symptoms: fever (100.6 F), slight nasal congestion, and cough Denies: sinus pain, rhinorrhea, itchy watery eyes, ear pain, ear drainage, sore throat, shortness of breath and myalgia Treatment to date: Mucinex, Tylenol which she took 2 hrs prior having her T checked here.  Sick contacts: Yes- son was ill  ROS:  Const: + fevers HEENT: As noted in HPI Lungs: No SOB  Past Medical History:  Diagnosis Date  . Allergic rhinitis    has seen ENT and allergy before  . Aorta coarctation    s/p correction 1983  . Birth control    Husband's vasectomy  . Dysplastic nevus 2016   midline inferior upper back  . Heart murmur   . History of shingles    mild during pregancy 2011  . Hyperlipidemia   . Palpable abd. aorta     normal Aorta and renal U/S 12-09   Family History  Problem Relation Age of Onset  . Hypertension Mother   . Hyperlipidemia Mother   . Hypothyroidism Mother   . Melanoma Father   . Diabetes Neg Hx   . Coronary artery disease Neg Hx   . Stroke Neg Hx   . Colon cancer Neg Hx   . Breast cancer Neg Hx     BP 131/86 (BP Location: Left Arm, Patient Position: Sitting, Cuff Size: Normal)   Pulse 75   Temp 98.4 F (36.9 C) (Oral)   Ht 5\' 6"  (1.676 m)   Wt 139 lb 12.8 oz (63.4 kg)   SpO2 100% Comment: RA  BMI 22.56 kg/m  General: Awake, alert, appears stated age HEENT: AT, [place], ears patent b/l and TM's neg, nares patent w/o discharge, no sinus tenderness, pharynx pink and without exudates, MMM Neck: No masses or asymmetry Heart: RRR, 3/6 SEM heard loudest at aortic listening post w radiation to carotids, no bruits Lungs: CTAB, no accessory muscle use Psych: Age appropriate judgment and insight, normal mood and affect  Acute bacterial bronchitis - Plan: azithromycin (ZITHROMAX) 250 MG tablet, benzonatate (TESSALON) 100 MG capsule  Orders  as above. Supportive care for next 2-3 days prior to taking abx. If continuing to have fevers, take abx sooner. If getting better, do not fill.  Continue to push fluids, practice good hand hygiene, cover mouth when coughing. F/u prn. If starting to experience sustained and high fevers, shaking, or shortness of breath, seek immediate care. Pt voiced understanding and agreement to the plan.  [place], DO 05/05/16 10:13 AM

## 2016-07-15 ENCOUNTER — Ambulatory Visit (INDEPENDENT_AMBULATORY_CARE_PROVIDER_SITE_OTHER): Payer: 59 | Admitting: Internal Medicine

## 2016-07-15 ENCOUNTER — Encounter: Payer: Self-pay | Admitting: Internal Medicine

## 2016-07-15 VITALS — BP 118/68 | HR 67 | Temp 97.8°F | Resp 12 | Ht 66.0 in | Wt 131.0 lb

## 2016-07-15 DIAGNOSIS — R011 Cardiac murmur, unspecified: Secondary | ICD-10-CM

## 2016-07-15 DIAGNOSIS — Q251 Coarctation of aorta: Secondary | ICD-10-CM

## 2016-07-15 DIAGNOSIS — R030 Elevated blood-pressure reading, without diagnosis of hypertension: Secondary | ICD-10-CM | POA: Diagnosis not present

## 2016-07-15 DIAGNOSIS — Z0000 Encounter for general adult medical examination without abnormal findings: Secondary | ICD-10-CM | POA: Diagnosis not present

## 2016-07-15 LAB — CBC WITH DIFFERENTIAL/PLATELET
BASOS ABS: 0 10*3/uL (ref 0.0–0.1)
Basophils Relative: 1 % (ref 0.0–3.0)
EOS PCT: 4.4 % (ref 0.0–5.0)
Eosinophils Absolute: 0.2 10*3/uL (ref 0.0–0.7)
HCT: 45 % (ref 36.0–46.0)
Hemoglobin: 15.1 g/dL — ABNORMAL HIGH (ref 12.0–15.0)
LYMPHS ABS: 1.9 10*3/uL (ref 0.7–4.0)
Lymphocytes Relative: 37.2 % (ref 12.0–46.0)
MCHC: 33.5 g/dL (ref 30.0–36.0)
MCV: 86 fl (ref 78.0–100.0)
MONO ABS: 0.3 10*3/uL (ref 0.1–1.0)
Monocytes Relative: 6.6 % (ref 3.0–12.0)
NEUTROS PCT: 50.8 % (ref 43.0–77.0)
Neutro Abs: 2.6 10*3/uL (ref 1.4–7.7)
Platelets: 272 10*3/uL (ref 150.0–400.0)
RBC: 5.24 Mil/uL — AB (ref 3.87–5.11)
RDW: 14 % (ref 11.5–15.5)
WBC: 5 10*3/uL (ref 4.0–10.5)

## 2016-07-15 LAB — BASIC METABOLIC PANEL
BUN: 13 mg/dL (ref 6–23)
CHLORIDE: 102 meq/L (ref 96–112)
CO2: 25 meq/L (ref 19–32)
Calcium: 10.4 mg/dL (ref 8.4–10.5)
Creatinine, Ser: 1.03 mg/dL (ref 0.40–1.20)
GFR: 62.31 mL/min (ref >60.00)
GLUCOSE: 89 mg/dL (ref 70–99)
POTASSIUM: 5.7 meq/L — AB (ref 3.5–5.1)
SODIUM: 138 meq/L (ref 135–145)

## 2016-07-15 LAB — LIPID PANEL
CHOL/HDL RATIO: 3
Cholesterol: 212 mg/dL — ABNORMAL HIGH (ref 0–200)
HDL: 74.5 mg/dL (ref >39.00)
LDL Cholesterol: 125 mg/dL — ABNORMAL HIGH (ref 0–99)
NONHDL: 137.69
Triglycerides: 63 mg/dL (ref 0.0–149.0)
VLDL: 12.6 mg/dL (ref 0.0–40.0)

## 2016-07-15 LAB — TSH: TSH: 2.42 u[IU]/mL (ref 0.35–4.50)

## 2016-07-15 MED ORDER — FLUTICASONE PROPIONATE 50 MCG/ACT NA SUSP: 2.0000 | Freq: Every day | NASAL | 12 refills | Status: DC

## 2016-07-15 MED ORDER — AZELASTINE HCL 0.1 % NA SOLN: 2.0000 | Freq: Every evening | NASAL | 12 refills | Status: DC | PRN

## 2016-07-15 NOTE — Progress Notes (Signed)
Subjective:    Patient ID: [name], female    DOB: August 21, 1975, 41 y.o.   MRN: [id number]  DOS:  07/15/2016 Type of visit - description : cpx Interval history:In general feeling well. No major concerns.   Review of Systems Had problem with allergies in the last few weeks but that is getting better.   Other than above, a 14 point review of systems is negative     Past Medical History:  Diagnosis Date  . Allergic rhinitis    has seen ENT and allergy before  . Aorta coarctation    s/p correction 1983  . Birth control    Husband's vasectomy  . Dysplastic nevus 2016   midline inferior upper back  . Heart murmur   . History of shingles    mild during pregancy 2011  . Hyperlipidemia   . Palpable abd. aorta     normal Aorta and renal U/S 12-09    Past Surgical History:  Procedure Laterality Date  . BREAST SURGERY  1999   Benign nodule removed  . CESAREAN SECTION     x2  . COARCTATION OF AORTA REPAIR  1983  . COLON SURGERY  [id number]   complication from [facility]  . [place] OF UTERUS  2006    Social History   Social History  . Marital status: Married    Spouse name: N/A  . Number of children: 2  . 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 Smoking status: Never Smoker  . Smokeless tobacco: Never Used  . Alcohol use Yes     Comment: Rare  . Drug use: No  . Sexual activity: Not on file   Other Topics Concern  . Not on file   Social History Narrative   P4G2   2 children 2008, 2011     Family History  Problem Relation Age of Onset  . Hypertension Mother   . Hyperlipidemia Mother   . Hypothyroidism Mother   . Melanoma Father   . Diabetes Neg Hx   . Coronary artery disease Neg Hx   . Stroke Neg Hx   . Colon cancer Neg Hx   . Breast cancer Neg Hx      Allergies as of 07/15/2016   No Known Allergies     Medication List       Accurate as of 07/15/16  1:09 PM. Always use  your most recent med list.          azelastine 0.1 % nasal spray Commonly known as:  ASTELIN Place 2 sprays into both nostrils at bedtime as needed for rhinitis. Use in each nostril as directed   buPROPion 150 MG 12 hr tablet Commonly known as:  WELLBUTRIN SR Take 1 tablet by mouth daily.   fluticasone 50 MCG/ACT nasal spray Commonly known as:  FLONASE Place 2 sprays into both nostrils daily.   loratadine 10 MG tablet Commonly known as:  CLARITIN Take 10 mg by mouth daily as needed for allergies.          Objective:   Physical Exam BP 118/68 (BP Location: Left Arm, Patient Position: Sitting, Cuff Size: Small)   Pulse 67   Temp 97.8 F (36.6 C) (Oral)   Resp 12   Ht 5\' 6"  (1.676 m)   Wt 131 lb (59.4 kg)   LMP 06/24/2016 (Exact Date)   SpO2 98%   BMI 21.14  kg/m   General:   Well developed, well nourished . NAD.  Neck: No  thyromegaly  HEENT:  Normocephalic . Face symmetric, atraumatic Lungs:  CTA B Normal respiratory effort, no intercostal retractions, no accessory muscle use. Heart: RRR,  soft systolic murmur.  No pretibial edema bilaterally  Abdomen:  Not distended, soft, non-tender. No rebound or rigidity.  Palpable aorta Skin: Exposed areas without rash. Not pale. Not jaundice Neurologic:  alert & oriented X3.  Speech normal, gait appropriate for age and unassisted Strength symmetric and appropriate for age.  Psych: Cognition and judgment appear intact.  Cooperative with normal attention span and concentration.  Behavior appropriate. No anxious or depressed appearing.    Assessment & Plan:   Assessment Hyperlipidemia Heart murmur Palpable aorta-- normal renal-aorta US 2009  Dysplastic nevus 2016, +FH melanoma-- sees derm q year Birth-control -- husband's  vasectomy  H/o Shingles 2011 Aorta coarctation, surgery 1983  Plan: Hyperlipidemia: Diet control. Heart murmur: Refer to Dr. Stanford Breed Seasonal allergies: RF meds, takes prn RTC 1 year

## 2016-07-15 NOTE — Assessment & Plan Note (Addendum)
--  Td-2013 --Female care per  gyn (Dr Helane Rima)  --CCS: never had a Cscope  --Change her diet over the last several months, eating more protein and less carbohydrates. Plans to go back to exercise more, has not been very active in the last few months. --Labs: BMP, FLP, CBC, TSH .

## 2016-07-15 NOTE — Progress Notes (Signed)
Pre visit review using our clinic review tool, if applicable. No additional management support is needed unless otherwise documented below in the visit note. 

## 2016-07-15 NOTE — Patient Instructions (Signed)
GO TO THE LAB : Get the blood work     GO TO THE FRONT DESK Schedule your next appointment for a  physical exam in one year  

## 2016-07-15 NOTE — Assessment & Plan Note (Signed)
Hyperlipidemia: Diet control. Heart murmur: Refer to Dr. Stanford Breed Seasonal allergies: RF meds, takes prn RTC 1 year

## 2016-07-18 NOTE — Addendum Note (Signed)
Addended byDamita Dunnings D on: 07/18/2016 08:05 AM   Modules accepted: Orders

## 2016-07-22 NOTE — Progress Notes (Signed)
HPI: FU coarctation of the aorta. Patient is status post repair of coarctation in 44. Abdominal ultrasound in December of 2009 showed a normal abdominal aorta, normal renal arteries.  Echocardiogram in May 2012 showed normal LV function. The aortic valve is trileaflet with no stenosis or regurgitation. MRA of thoracic aorta June 2015 showed mild residual narrowing involving the proximal descending thoracic aorta at level of coarctation repair but not resulting in hemodynamically significant stenosis. Since I last saw her in May 2015, patient denies dyspnea, chest pain, palpitations, syncope or pedal edema.   Current Outpatient Prescriptions  Medication Sig Dispense Refill  . azelastine (ASTELIN) 0.1 % nasal spray Place 2 sprays into both nostrils at bedtime as needed for rhinitis. Use in each nostril as directed 30 mL 12  . buPROPion (WELLBUTRIN SR) 150 MG 12 hr tablet Take 1 tablet by mouth daily.    . fluticasone (FLONASE) 50 MCG/ACT nasal spray Place 2 sprays into both nostrils daily. 16 g 12  . loratadine (CLARITIN) 10 MG tablet Take 10 mg by mouth daily as needed for allergies.     No current facility-administered medications for this visit.      Past Medical History:  Diagnosis Date  . Allergic rhinitis    has seen ENT and allergy before  . Aorta coarctation    s/p correction 1983  . Birth control    Husband's vasectomy  . Dysplastic nevus 2016   midline inferior upper back  . Heart murmur   . History of shingles    mild during pregancy 2011  . Hyperlipidemia   . Palpable abd. aorta     normal Aorta and renal U/S 12-09    Past Surgical History:  Procedure Laterality Date  . BREAST SURGERY  1999   Benign nodule removed  . CESAREAN SECTION     x2  . COARCTATION OF AORTA REPAIR  1983  . COLON SURGERY  4098   complication from [facility]  . [place] OF UTERUS  2006    Social History   Social History  . Marital status: Married    Spouse name: N/A  .  Number of children: 2  . 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 Smoking status: Never Smoker  . Smokeless tobacco: Never Used  . Alcohol use Yes     Comment: Rare  . Drug use: No  . Sexual activity: Not on file   Other Topics Concern  . Not on file   Social History Narrative   P4G2   2 children 2008, 2011    Family History  Problem Relation Age of Onset  . Hypertension Mother   . Hyperlipidemia Mother   . Hypothyroidism Mother   . Melanoma Father   . Diabetes Neg Hx   . Coronary artery disease Neg Hx   . Stroke Neg Hx   . Colon cancer Neg Hx   . Breast cancer Neg Hx     ROS: no fevers or chills, productive cough, hemoptysis, dysphasia, odynophagia, melena, hematochezia, dysuria, hematuria, rash, seizure activity, orthopnea, PND, pedal edema, claudication. Remaining systems are negative.  Physical Exam:  Systolic blood pressure in right arm 138 and left arm 150  Well-developed well-nourished in no acute distress.  Skin is warm and dry.  HEENT is normal.  Neck is supple.  Chest is clear to auscultation with normal expansion.  Cardiovascular exam is  regular rate and rhythm. 2/6 systolic murmur left sternal border. Abdominal exam nontender or distended. No masses palpated. Extremities show no edema. neuro grossly intact  ECG- Sinus rhythm at a rate of 62. Cannot rule out prior septal infarct. personally reviewed  A/P  1 coarctation of the aorta-plan repeat CTA of the thoracic aorta.  2 murmur-patient has a significant murmur on examination. However previous echocardiogram showed trileaflet aortic valve with no aortic stenosis or aortic insufficiency. Her murmur has been present for years.  Kirk Ruths, MD

## 2016-07-25 ENCOUNTER — Other Ambulatory Visit (INDEPENDENT_AMBULATORY_CARE_PROVIDER_SITE_OTHER): Payer: 59

## 2016-07-25 DIAGNOSIS — R030 Elevated blood-pressure reading, without diagnosis of hypertension: Secondary | ICD-10-CM

## 2016-07-25 LAB — BASIC METABOLIC PANEL
BUN: 12 mg/dL (ref 6–23)
CHLORIDE: 103 meq/L (ref 96–112)
CO2: 25 mEq/L (ref 19–32)
Calcium: 9.6 mg/dL (ref 8.4–10.5)
Creatinine, Ser: 0.92 mg/dL (ref 0.40–1.20)
GFR: 70.97 mL/min (ref >60.00)
GLUCOSE: 84 mg/dL (ref 70–99)
POTASSIUM: 3.7 meq/L (ref 3.5–5.1)
Sodium: 137 mEq/L (ref 135–145)

## 2016-07-27 ENCOUNTER — Ambulatory Visit (INDEPENDENT_AMBULATORY_CARE_PROVIDER_SITE_OTHER): Payer: 59 | Admitting: Cardiology

## 2016-07-27 ENCOUNTER — Encounter: Payer: Self-pay | Admitting: Cardiology

## 2016-07-27 VITALS — BP 132/79 | HR 62 | Ht 66.0 in | Wt 132.4 lb

## 2016-07-27 DIAGNOSIS — R011 Cardiac murmur, unspecified: Secondary | ICD-10-CM | POA: Diagnosis not present

## 2016-07-27 DIAGNOSIS — Q251 Coarctation of aorta: Secondary | ICD-10-CM

## 2016-07-27 NOTE — Patient Instructions (Signed)
Medication Instructions:   NO CHANGE  Testing/Procedures:  Non-Cardiac CT Angiography (CTA), is a special type of CT scan that uses a computer to produce multi-dimensional views of major blood vessels throughout the body. In CT angiography, a contrast material is injected through an IV to help visualize the blood vessels   Follow-Up:  Your physician wants you to follow-up in: [place] will receive a reminder letter in the mail two months in advance. If you don't receive a letter, please call our office to schedule the follow-up appointment.   If you need a refill on your cardiac medications before your next appointment, please call your pharmacy.

## 2016-07-28 ENCOUNTER — Other Ambulatory Visit: Payer: Self-pay | Admitting: Internal Medicine

## 2016-08-24 ENCOUNTER — Ambulatory Visit (HOSPITAL_BASED_OUTPATIENT_CLINIC_OR_DEPARTMENT_OTHER)
Admission: RE | Admit: 2016-08-24 | Discharge: 2016-08-24 | Disposition: A | Discharge status: Home / Self Care | Payer: 59 | Source: Ambulatory Visit | Attending: Cardiology | Admitting: Cardiology

## 2016-08-24 ENCOUNTER — Encounter (HOSPITAL_BASED_OUTPATIENT_CLINIC_OR_DEPARTMENT_OTHER): Payer: Self-pay

## 2016-08-24 DIAGNOSIS — Q251 Coarctation of aorta: Secondary | ICD-10-CM | POA: Diagnosis present

## 2016-08-24 MED ORDER — IOPAMIDOL (ISOVUE-370) INJECTION 76%
100.0000 mL | Freq: Once | INTRAVENOUS | Status: AC | PRN
  Administered 2016-08-24: 100 mL via INTRAVENOUS

## 2017-02-01 DIAGNOSIS — D2261 Melanocytic nevi of right upper limb, including shoulder: Secondary | ICD-10-CM | POA: Diagnosis not present

## 2017-02-01 DIAGNOSIS — D2262 Melanocytic nevi of left upper limb, including shoulder: Secondary | ICD-10-CM | POA: Diagnosis not present

## 2017-02-01 DIAGNOSIS — D1801 Hemangioma of skin and subcutaneous tissue: Secondary | ICD-10-CM | POA: Diagnosis not present

## 2017-02-09 DIAGNOSIS — Z0100 Encounter for examination of eyes and vision without abnormal findings: Secondary | ICD-10-CM | POA: Diagnosis not present

## 2017-02-24 DIAGNOSIS — Z6822 Body mass index (BMI) 22.0-22.9, adult: Secondary | ICD-10-CM | POA: Diagnosis not present

## 2017-02-24 DIAGNOSIS — Z01419 Encounter for gynecological examination (general) (routine) without abnormal findings: Secondary | ICD-10-CM | POA: Diagnosis not present

## 2017-02-24 LAB — HM PAP SMEAR

## 2017-02-24 LAB — HM MAMMOGRAPHY

## 2017-07-17 ENCOUNTER — Encounter: Payer: Self-pay | Admitting: Internal Medicine

## 2017-07-17 ENCOUNTER — Ambulatory Visit (INDEPENDENT_AMBULATORY_CARE_PROVIDER_SITE_OTHER): Payer: 59 | Admitting: Internal Medicine

## 2017-07-17 VITALS — BP 126/66 | HR 77 | Temp 97.6°F | Resp 14 | Ht 66.0 in | Wt 142.4 lb

## 2017-07-17 DIAGNOSIS — Z0000 Encounter for general adult medical examination without abnormal findings: Secondary | ICD-10-CM | POA: Diagnosis not present

## 2017-07-17 DIAGNOSIS — N951 Menopausal and female climacteric states: Secondary | ICD-10-CM | POA: Insufficient documentation

## 2017-07-17 MED ORDER — AZELASTINE HCL 0.1 % NA SOLN: 2.0000 | Freq: Every evening | NASAL | 12 refills | Status: DC | PRN

## 2017-07-17 MED ORDER — FLUTICASONE PROPIONATE 50 MCG/ACT NA SUSP: 2.0000 | Freq: Every day | NASAL | 12 refills | Status: DC

## 2017-07-17 MED ORDER — FLUOXETINE HCL 20 MG PO TABS: 20.0000 mg | ORAL_TABLET | Freq: Every day | ORAL | 3 refills | Status: DC

## 2017-07-17 NOTE — Assessment & Plan Note (Addendum)
--  Td-2013 --Female care per  gyn (Dr Helane Rima) . Last visit 02/2017, had a MMG and female check (not on KPN) --CCS: never had a Cscope  --Labs: CMP, FLP, CBC. --Lifestyle discussed

## 2017-07-17 NOTE — Progress Notes (Signed)
Pre visit review using our clinic review tool, if applicable. No additional management support is needed unless otherwise documented below in the visit note. 

## 2017-07-17 NOTE — Patient Instructions (Signed)
GO TO THE LAB : Get the blood work     GO TO THE FRONT DESK Schedule your next appointment for a checkup physical exam in 1 year  Stop Wellbutrin  Start fluoxetine 20 mg: The first week, take  half tablet every day. After 1 week, take a whole tablet. Call for refills when needed If that is not working for you please let me know

## 2017-07-17 NOTE — Progress Notes (Signed)
Subjective:    Patient ID: [name], female    DOB: November 30, 1975, 42 y.o.   MRN: [id number]  DOS:  07/17/2017 Type of visit - description : cpx Interval history: In general feeling well. She does like to change Wellbutrin. At around age 53, she become very emotional around her periods and had episodes of crying and mood swings. Her gynecologist prescribed Wellbutrin SR and that worked well for her. In December, Wellbutrin was changed to the XL formulation and since then she does not like it.  She has gotten somewhat anxious. Denies major problems with depression or suicidal ideas. Request a change in medication.   Review of Systems  Other than above, a 14 point review of systems is negative      Past Medical History:  Diagnosis Date  . Allergic rhinitis    has seen ENT and allergy before  . Aorta coarctation    s/p correction 1983  . Birth control    Husband's vasectomy  . Dysplastic nevus 2016   midline inferior upper back  . Heart murmur   . History of shingles    mild during pregancy 2011  . Hyperlipidemia   . Palpable abd. aorta     normal Aorta and renal U/S 12-09    Past Surgical History:  Procedure Laterality Date  . BREAST SURGERY  1999   Benign nodule removed  . CESAREAN SECTION     x2  . COARCTATION OF AORTA REPAIR  1983  . COLON SURGERY  4008   complication from [facility]  . DILATION AND CURETTAGE OF UTERUS  2006    Social History   Socioeconomic History  . Marital status: Married    Spouse name: Not on file  . Number of children: 2  . Years of education: Not on file  . Highest education level: Not on file  Occupational History  . 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 Financial resource strain: Not on file  . Food insecurity:    Worry: Not on file    Inability: Not on file  . Transportation needs:    Medical: Not on file    Non-medical: Not on file  Tobacco Use  . Smoking status: Never Smoker  . Smokeless  tobacco: Never Used  Substance and Sexual Activity  . Alcohol use: Yes    Comment: Rare  . Drug use: No  . Sexual activity: Not on file  Lifestyle  . Physical activity:    Days per week: Not on file    Minutes per session: Not on file  . Stress: Not on file  Relationships  . Social connections:    Talks on phone: Not on file    Gets together: Not on file    Attends religious service: Not on file    Active member of club or organization: Not on file    Attends meetings of clubs or organizations: Not on file    Relationship status: Not on file  . Intimate partner violence:    Fear of current or ex partner: Not on file    Emotionally abused: Not on file    Physically abused: Not on file    Forced sexual activity: Not on file  Other Topics Concern  . Not on file  Social History Narrative   P4G2   2 children 2008, 2011     Family History  Problem Relation Age of Onset  . Hypertension Mother   .  Hyperlipidemia Mother   . Hypothyroidism Mother   . Melanoma Father   . Diabetes Neg Hx   . Coronary artery disease Neg Hx   . Stroke Neg Hx   . Colon cancer Neg Hx   . Breast cancer Neg Hx      Allergies as of 07/17/2017   No Known Allergies     Medication List        Accurate as of 07/17/17  9:21 PM. Always use your most recent med list.          azelastine 0.1 % nasal spray Commonly known as:  ASTELIN Place 2 sprays into both nostrils at bedtime as needed for rhinitis. Use in each nostril as directed   ELDERBERRY PO Take by mouth.   FLUoxetine 20 MG tablet Commonly known as:  PROZAC Take 1 tablet (20 mg total) by mouth daily.   fluticasone 50 MCG/ACT nasal spray Commonly known as:  FLONASE Place 2 sprays into both nostrils daily.   loratadine 10 MG tablet Commonly known as:  CLARITIN Take 10 mg by mouth daily as needed for allergies.          Objective:   Physical Exam BP 126/66 (BP Location: Left Arm, Patient Position: Sitting, Cuff Size: Small)    Pulse 77   Temp 97.6 F (36.4 C) (Oral)   Resp 14   Ht 5\' 6"  (1.676 m)   Wt 142 lb 6 oz (64.6 kg)   SpO2 96%   BMI 22.98 kg/m  General:   Well developed, well nourished . NAD.  Neck: No  thyromegaly  HEENT:  Normocephalic . Face symmetric, atraumatic Lungs:  CTA B Normal respiratory effort, no intercostal retractions, no accessory muscle use. Heart: RRR, + systolic murmur.  No pretibial edema bilaterally  Abdomen:  Not distended, soft, non-tender. No rebound or rigidity.   Skin: Exposed areas without rash. Not pale. Not jaundice Neurologic:  alert & oriented X3.  Speech normal, gait appropriate for age and unassisted Strength symmetric and appropriate for age.  Psych: Cognition and judgment appear intact.  Cooperative with normal attention span and concentration.  Behavior appropriate. No anxious or depressed appearing.     Assessment & Plan:   Assessment Hyperlipidemia Heart murmur Palpable aorta-- normal renal-aorta US 2009  Dysplastic nevus 2016, +FH melanoma-- sees derm q year Birth-control -- husband's  vasectomy  H/o Shingles 2011 Aorta coarctation, surgery 1983  Plan:  Hyperlipidemia: Diet controlled, lifestyle has not been the best lately, will check a lipid profile, she is not fasting.  Consider recheck fasting labs if not well controlled H/o aortic coarctation surgery: Saw cardiology, CT angios chest 08/2016 stable. Heart murmur: Saw cardiology, they noted the murmur, previous echo showed trileaflet aortic.  She was felt to be stable Perimenopausal: At around age 10, she become very emotional around her periods, had episodes of crying and mood swings. Her gynecologist prescribed Wellbutrin SR and that worked well for her. In December 2018, Wellbutrin was changed to the XL formulation and since then she does not like it.  She has gotten somewhat anxious.  We talked about going back on Wellbutrin SR but she prefers to change medication.  Will stop Wellbutrin  and start fluoxetine.  See instructions.  Call if not improving. RTC 1 year.

## 2017-07-17 NOTE — Assessment & Plan Note (Signed)
Hyperlipidemia: Diet controlled, lifestyle has not been the best lately, will check a lipid profile, she is not fasting.  Consider recheck fasting labs if not well controlled H/o aortic coarctation surgery: Saw cardiology, CT angios chest 08/2016 stable. Heart murmur: Saw cardiology, they noted the murmur, previous echo showed trileaflet aortic.  She was felt to be stable Perimenopausal: At around age 42, she become very emotional around her periods, had episodes of crying and mood swings. Her gynecologist prescribed Wellbutrin SR and that worked well for her. In December 2018, Wellbutrin was changed to the XL formulation and since then she does not like it.  She has gotten somewhat anxious.  We talked about going back on Wellbutrin SR but she prefers to change medication.  Will stop Wellbutrin and start fluoxetine.  See instructions.  Call if not improving. RTC 1 year.

## 2017-07-18 LAB — CBC WITH DIFFERENTIAL/PLATELET
BASOS ABS: 0.1 10*3/uL (ref 0.0–0.1)
Basophils Relative: 0.9 % (ref 0.0–3.0)
Eosinophils Absolute: 0.2 10*3/uL (ref 0.0–0.7)
Eosinophils Relative: 3.2 % (ref 0.0–5.0)
HEMATOCRIT: 42.1 % (ref 36.0–46.0)
HEMOGLOBIN: 14.2 g/dL (ref 12.0–15.0)
LYMPHS PCT: 31.7 % (ref 12.0–46.0)
Lymphs Abs: 2.2 10*3/uL (ref 0.7–4.0)
MCHC: 33.7 g/dL (ref 30.0–36.0)
MCV: 86.4 fl (ref 78.0–100.0)
Monocytes Absolute: 0.4 10*3/uL (ref 0.1–1.0)
Monocytes Relative: 5.8 % (ref 3.0–12.0)
Neutro Abs: 4 10*3/uL (ref 1.4–7.7)
Neutrophils Relative %: 58.4 % (ref 43.0–77.0)
Platelets: 353 10*3/uL (ref 150.0–400.0)
RBC: 4.87 Mil/uL (ref 3.87–5.11)
RDW: 13.3 % (ref 11.5–15.5)
WBC: 6.9 10*3/uL (ref 4.0–10.5)

## 2017-07-18 LAB — COMPREHENSIVE METABOLIC PANEL
ALK PHOS: 39 U/L (ref 39–117)
ALT: 16 U/L (ref 0–35)
AST: 21 U/L (ref 0–37)
Albumin: 4.6 g/dL (ref 3.5–5.2)
BILIRUBIN TOTAL: 0.4 mg/dL (ref 0.2–1.2)
BUN: 11 mg/dL (ref 6–23)
CALCIUM: 9.7 mg/dL (ref 8.4–10.5)
CO2: 28 mEq/L (ref 19–32)
Chloride: 102 mEq/L (ref 96–112)
Creatinine, Ser: 0.84 mg/dL (ref 0.40–1.20)
GFR: 78.46 mL/min (ref >60.00)
Glucose, Bld: 75 mg/dL (ref 70–99)
POTASSIUM: 4.3 meq/L (ref 3.5–5.1)
Sodium: 139 mEq/L (ref 135–145)
Total Protein: 7.2 g/dL (ref 6.0–8.3)

## 2017-07-18 LAB — LIPID PANEL
CHOL/HDL RATIO: 3
Cholesterol: 212 mg/dL — ABNORMAL HIGH (ref 0–200)
HDL: 78.9 mg/dL (ref >39.00)
LDL Cholesterol: 119 mg/dL — ABNORMAL HIGH (ref 0–99)
NONHDL: 132.88
TRIGLYCERIDES: 70 mg/dL (ref 0.0–149.0)
VLDL: 14 mg/dL (ref 0.0–40.0)

## 2017-07-19 ENCOUNTER — Encounter: Payer: 59 | Admitting: Internal Medicine

## 2017-11-05 ENCOUNTER — Other Ambulatory Visit: Payer: Self-pay | Admitting: Internal Medicine

## 2017-11-05 IMAGING — CT CT ANGIO CHEST
3 of 6 series · 19 of 36 positions shown · IV contrast (APPLIED)
Comparison: 08/09/2013, 07/14/2010

CLINICAL DATA: Follow-up following coarctation of the aorta repair

EXAM:
CT ANGIOGRAPHY CHEST WITH CONTRAST
TECHNIQUE: Multidetector CT imaging of the chest was performed using the
standard protocol during bolus administration of intravenous
contrast. Multiplanar CT image reconstructions and MIPs were
obtained to evaluate the vascular anatomy.
CONTRAST:  100 mL Isovue 370.

[Series 4: axial arterial · axial · arterial · 0.60mm/px · z∈[-280,-28]mm · 15 of 97 slices shown]
[im 7/97  lung]
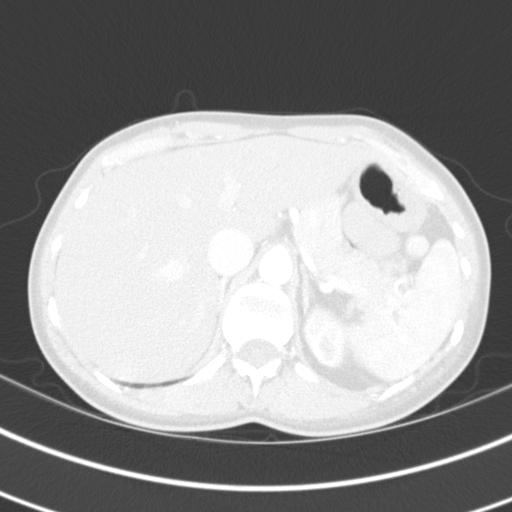
[im 13/97  mediastinal]
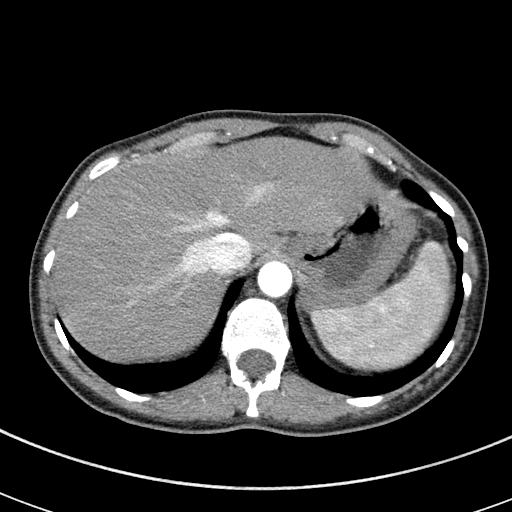
[im 19/97  lung]
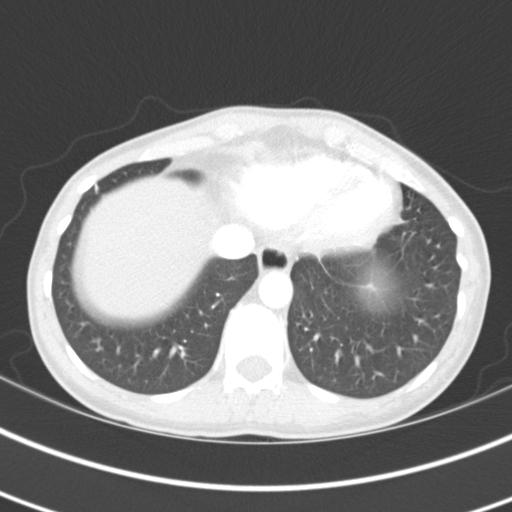
[im 25/97  mediastinal]
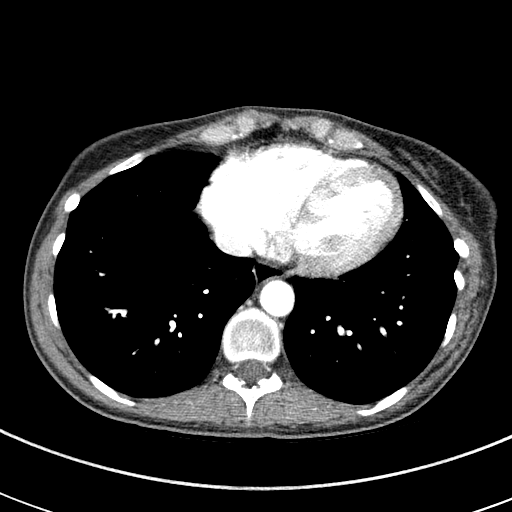
[im 31/97  lung]
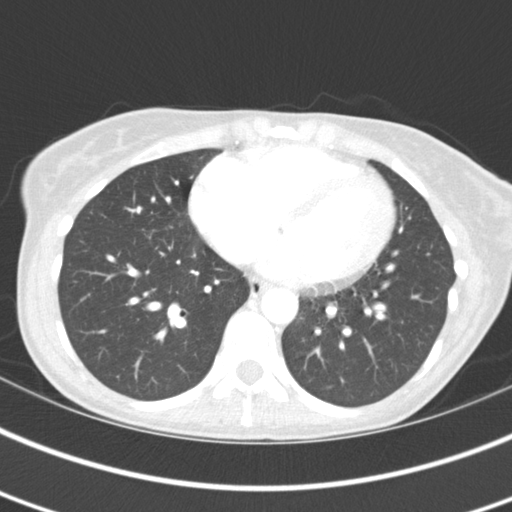
[im 37/97  mediastinal]
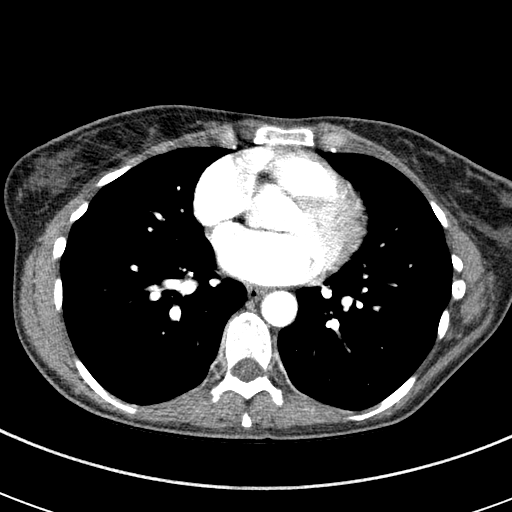
[im 43/97  lung]
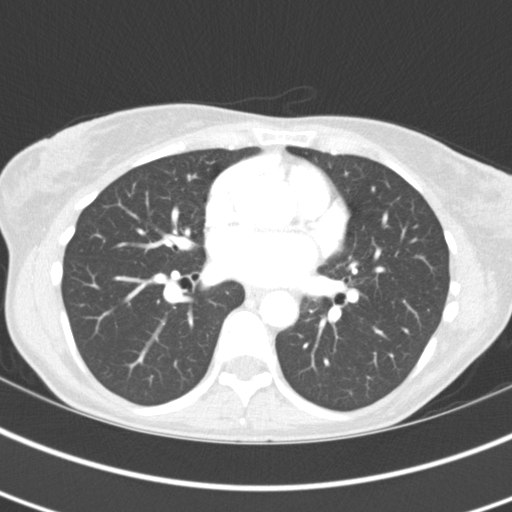
[im 49/97  mediastinal]
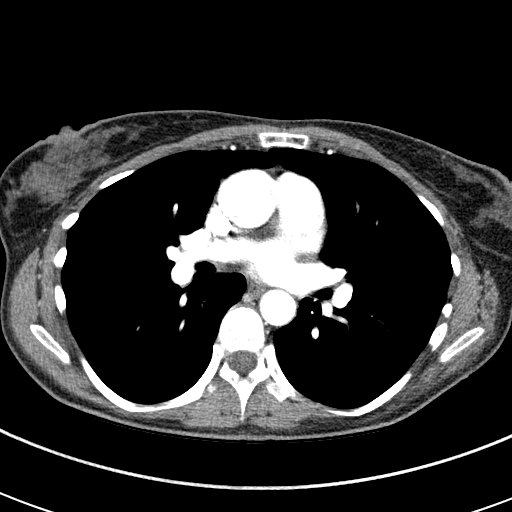
[im 55/97  lung]
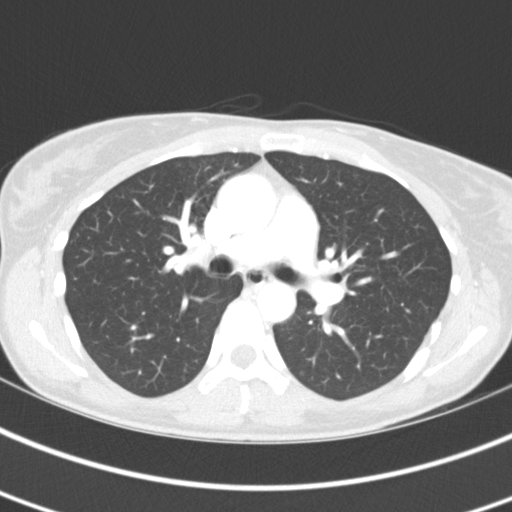
[im 61/97  mediastinal]
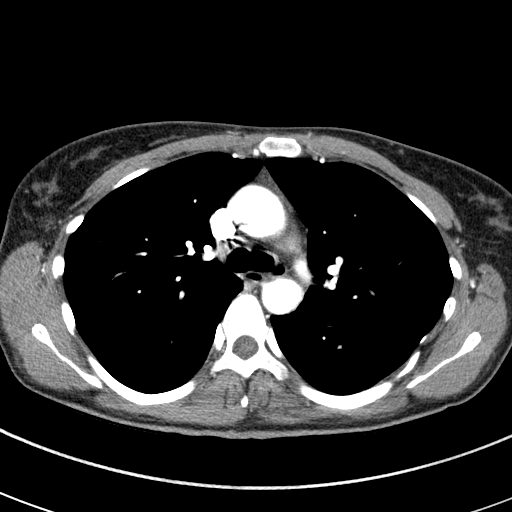
[im 67/97  lung]
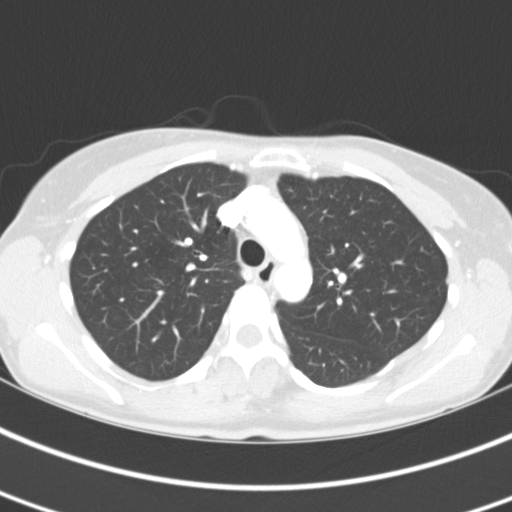
[im 73/97  mediastinal]
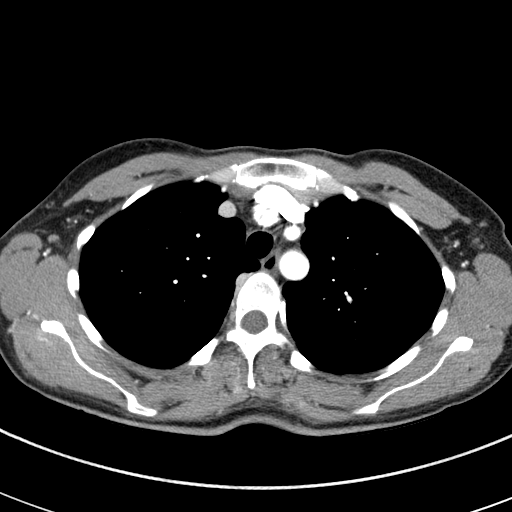
[im 79/97  lung]
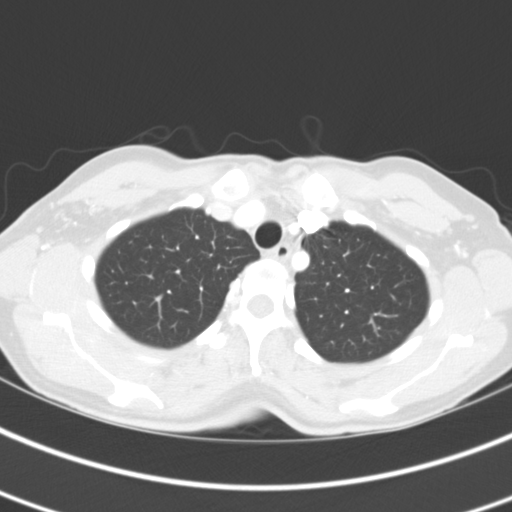
[im 85/97  mediastinal]
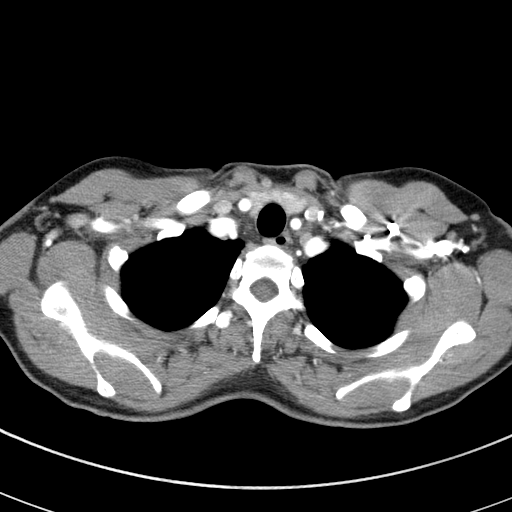
[im 91/97  lung]
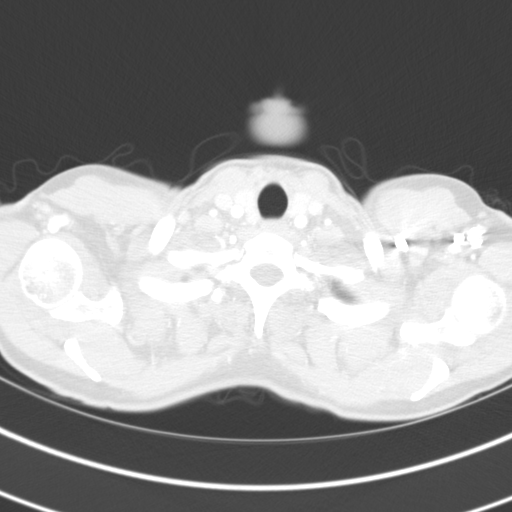

[Series 5: lung · axial · 0.60mm/px · z∈[-275,-215]mm · 3 of 59 slices shown]
[im 6/59  mediastinal]
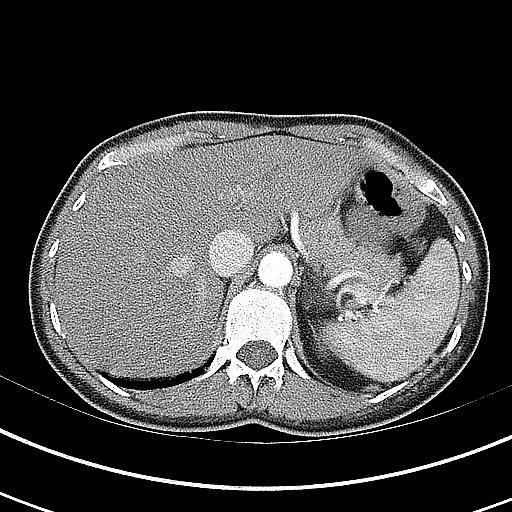
[im 12/59  mediastinal]
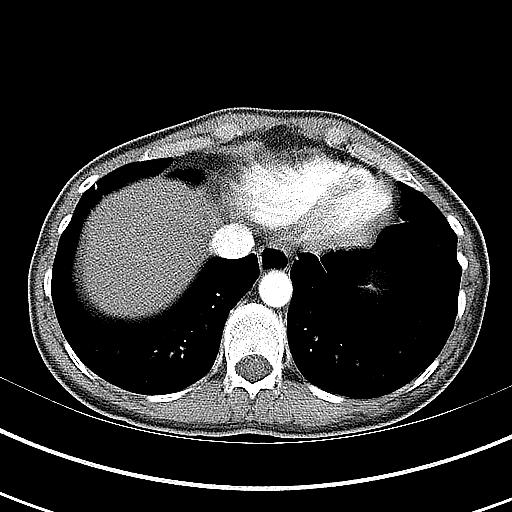
[im 18/59  mediastinal]
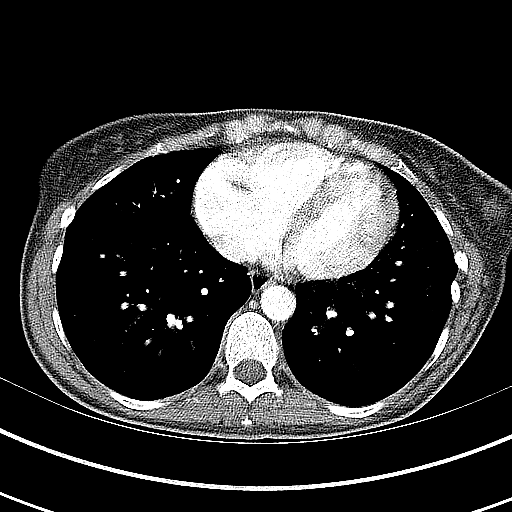

[Series 6: coronal · coronal · 0.59mm/px · 1 of 68 slices shown]
[im 34/68  mediastinal]
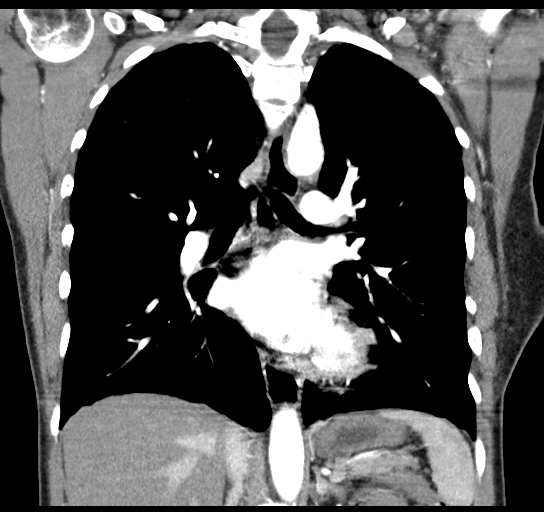

[19 of 36 positions shown; findings below may reference images not displayed]

FINDINGS: Cardiovascular: The thoracic aorta is again well visualize with a
normal branching pattern. Focal calcifications are noted in the area
of prior coarctation repair and stable. There remains a very mild
contour deformity of the aorta stable from the prior exam without
focal hemodynamically significant stenosis. No aneurysmal dilatation
is seen. No significant cardiac enlargement is noted. No coronary
calcifications are noted. The pulmonary artery as visualized is
within normal limits.

Mediastinum/Nodes: The thoracic inlet is within normal limits. No
hilar or mediastinal adenopathy is noted. The esophagus as
visualized is within normal limits.

Lungs/Pleura: The lungs are well aerated bilaterally without focal
infiltrate or sizable effusion. No parenchymal nodules are seen.

Upper Abdomen: No acute abnormality.

Musculoskeletal: No chest wall abnormality. No acute or significant
osseous findings.

Review of the MIP images confirms the above findings.
IMPRESSION: Changes consistent with prior repair of coarctation of the aorta.
Mild residual narrowing is noted in the proximal descending aorta.
This does not appear to be flow limiting and is stable from the
prior exams.

No other focal abnormality is noted.

## 2017-11-10 ENCOUNTER — Encounter: Payer: Self-pay | Admitting: Internal Medicine

## 2017-12-04 ENCOUNTER — Encounter: Payer: Self-pay | Admitting: Internal Medicine

## 2017-12-04 ENCOUNTER — Ambulatory Visit (INDEPENDENT_AMBULATORY_CARE_PROVIDER_SITE_OTHER): Payer: 59 | Admitting: Internal Medicine

## 2017-12-04 VITALS — BP 132/76 | HR 61 | Temp 98.0°F | Resp 16 | Ht 66.0 in | Wt 142.0 lb

## 2017-12-04 DIAGNOSIS — S233XXA Sprain of ligaments of thoracic spine, initial encounter: Secondary | ICD-10-CM

## 2017-12-04 NOTE — Patient Instructions (Signed)
  Tylenol  500 mg OTC 2 tabs a day every 8 hours as needed for pain  IBUPROFEN (Advil or Motrin) 200 mg 2 tablets every 6 hours as needed for pain for few days.  Always take it with food because may cause gastritis and ulcers.  If you notice nausea, stomach pain, change in the color of stools --->  Stop the medicine and let us know   Stretching twice a day  Check the [facility] website

## 2017-12-04 NOTE — Progress Notes (Signed)
Pre visit review using our clinic review tool, if applicable. No additional management support is needed unless otherwise documented below in the visit note. 

## 2017-12-04 NOTE — Progress Notes (Signed)
Subjective:    Patient ID: [name], female    DOB: [birth year], 42 y.o.   MRN: [id number]  DOS:  12/04/2017 Type of visit - description : acute Interval history: Sx startyed approximately 10 days ago when she was vacuuming at home.  Developed right trapezoid area pain with no radiation. Pain described as a stiffness,  a burning sensation. Has been doing ice, heat and ibuprofen with some help, is feeling slightly better.  Review of Systems Denies paresthesias.  No neck pain per se, range of motion is normal  Past Medical History:  Diagnosis Date  . Allergic rhinitis    has seen ENT and allergy before  . Aorta coarctation    s/p correction 1983  . Birth control    Husband's vasectomy  . Dysplastic nevus 2016   midline inferior upper back  . Heart murmur   . History of shingles    mild during pregancy 2011  . Hyperlipidemia   . Palpable abd. aorta     normal Aorta and renal U/S 12-09    Past Surgical History:  Procedure Laterality Date  . BREAST SURGERY  1999   Benign nodule removed  . CESAREAN SECTION     x2  . COARCTATION OF AORTA REPAIR  1983  . COLON SURGERY  8841   [facility]  . DILATION AND CURETTAGE OF UTERUS  2006    Social History   Socioeconomic History  . Marital status: Married    Spouse name: Not on file  . Number of children: 2  . Years of education: Not on file  . Highest education level: Not on file  Occupational History  . 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 Financial resource strain: Not on file  . Food insecurity:    Worry: Not on file    Inability: Not on file  . Transportation needs:    Medical: Not on file    Non-medical: Not on file  Tobacco Use  . Smoking status: Never Smoker  . Smokeless tobacco: Never Used  Substance and Sexual Activity  . Alcohol use: Yes    Comment: Rare  . Drug use: No  . Sexual activity: Not on file  Lifestyle  . Physical activity:    Days per week: Not  on file    Minutes per session: Not on file  . Stress: Not on file  Relationships  . Social connections:    Talks on phone: Not on file    Gets together: Not on file    Attends religious service: Not on file    Active member of club or organization: Not on file    Attends meetings of clubs or organizations: Not on file    Relationship status: Not on file  . Intimate partner violence:    Fear of current or ex partner: Not on file    Emotionally abused: Not on file    Physically abused: Not on file    Forced sexual activity: Not on file  Other Topics Concern  . Not on file  Social History Narrative   P4G2   2 children 2008, 2011      Allergies as of 12/04/2017   No Known Allergies     Medication List        Accurate as of 12/04/17  9:57 AM. Always use your most recent med list.          azelastine 0.1 % nasal  spray Commonly known as:  ASTELIN Place 2 sprays into both nostrils at bedtime as needed for rhinitis. Use in each nostril as directed   ELDERBERRY PO Take by mouth.   FLUoxetine 20 MG tablet Commonly known as:  PROZAC Take 1 tablet (20 mg total) by mouth daily.   fluticasone 50 MCG/ACT nasal spray Commonly known as:  FLONASE Place 2 sprays into both nostrils daily.   loratadine 10 MG tablet Commonly known as:  CLARITIN Take 10 mg by mouth daily as needed for allergies.          Objective:   Physical Exam  Neck:     BP 132/76 (BP Location: Left Arm, Patient Position: Sitting, Cuff Size: Small)   Pulse 61   Temp 98 F (36.7 C) (Oral)   Resp 16   Ht 5\' 6"  (1.676 m)   Wt 142 lb (64.4 kg)   LMP 11/20/2017 (Approximate)   SpO2 97%   BMI 22.92 kg/m  General:   Well developed, NAD, see BMI.  HEENT:  Normocephalic . Face symmetric, atraumatic  Skin: Not pale. Not jaundice Neurologic:  alert & oriented X3.  Speech normal, gait appropriate for age and unassisted.  Motor and DTRs symmetric Psych--  Cognition and judgment appear intact.    Cooperative with normal attention span and concentration.  Behavior appropriate. No anxious or depressed appearing.       Assessment & Plan:   Assessment Hyperlipidemia Heart murmur Palpable aorta-- normal renal-aorta US 2009  Dysplastic nevus 2016, +FH melanoma-- sees derm q year Birth-control -- husband's  vasectomy  H/o Shingles 2011 Aorta coarctation, surgery 1983  Plan:  Trapezoid sprain: Started 10 days ago in the context of vacuuming, already slightly better.  Recommend to continue with conservative treatment, Tylenol, ibuprofen, heating pad. 3 modalities of the stretching discussed, also recommend to check the [facility] website for further exercises. Call if not improving, formal PT?  Round of steroid?  Flexeril?Marland Kitchen

## 2017-12-05 NOTE — Assessment & Plan Note (Signed)
Trapezoid sprain: Started 10 days ago in the context of vacuuming, already slightly better.  Recommend to continue with conservative treatment, Tylenol, ibuprofen, heating pad. 3 modalities of the stretching discussed, also recommend to check the [facility] website for further exercises. Call if not improving, formal PT?  Round of steroid?  Flexeril?Marland Kitchen

## 2017-12-12 ENCOUNTER — Other Ambulatory Visit: Payer: Self-pay | Admitting: Internal Medicine

## 2017-12-12 ENCOUNTER — Encounter: Payer: Self-pay | Admitting: Internal Medicine

## 2017-12-12 MED ORDER — PREDNISONE 10 MG PO TABS: ORAL_TABLET | ORAL | 0 refills | Status: DC

## 2017-12-12 MED ORDER — CYCLOBENZAPRINE HCL 10 MG PO TABS: 10.0000 mg | ORAL_TABLET | Freq: Every evening | ORAL | 0 refills | Status: DC | PRN

## 2018-02-16 DIAGNOSIS — M9902 Segmental and somatic dysfunction of thoracic region: Secondary | ICD-10-CM | POA: Diagnosis not present

## 2018-02-16 DIAGNOSIS — M9903 Segmental and somatic dysfunction of lumbar region: Secondary | ICD-10-CM | POA: Diagnosis not present

## 2018-02-16 DIAGNOSIS — M9905 Segmental and somatic dysfunction of pelvic region: Secondary | ICD-10-CM | POA: Diagnosis not present

## 2018-02-22 DIAGNOSIS — M9902 Segmental and somatic dysfunction of thoracic region: Secondary | ICD-10-CM | POA: Diagnosis not present

## 2018-02-22 DIAGNOSIS — M9905 Segmental and somatic dysfunction of pelvic region: Secondary | ICD-10-CM | POA: Diagnosis not present

## 2018-02-22 DIAGNOSIS — M9903 Segmental and somatic dysfunction of lumbar region: Secondary | ICD-10-CM | POA: Diagnosis not present

## 2018-02-27 ENCOUNTER — Encounter: Payer: Self-pay | Admitting: Medical

## 2018-02-27 ENCOUNTER — Ambulatory Visit (INDEPENDENT_AMBULATORY_CARE_PROVIDER_SITE_OTHER): Payer: 59 | Admitting: Medical

## 2018-02-27 VITALS — BP 145/68 | HR 71 | Temp 98.4°F | Resp 16 | Ht 66.0 in | Wt 146.8 lb

## 2018-02-27 DIAGNOSIS — R059 Cough, unspecified: Secondary | ICD-10-CM

## 2018-02-27 DIAGNOSIS — J40 Bronchitis, not specified as acute or chronic: Secondary | ICD-10-CM | POA: Diagnosis not present

## 2018-02-27 DIAGNOSIS — J0100 Acute maxillary sinusitis, unspecified: Secondary | ICD-10-CM | POA: Diagnosis not present

## 2018-02-27 DIAGNOSIS — R05 Cough: Secondary | ICD-10-CM

## 2018-02-27 MED ORDER — BENZONATATE 100 MG PO CAPS: 100.0000 mg | ORAL_CAPSULE | Freq: Three times a day (TID) | ORAL | 0 refills | Status: DC | PRN

## 2018-02-27 MED ORDER — DOXYCYCLINE HYCLATE 100 MG PO TABS: 100.0000 mg | ORAL_TABLET | Freq: Two times a day (BID) | ORAL | 0 refills | Status: DC

## 2018-02-27 NOTE — Progress Notes (Signed)
Subjective:    Patient ID: [name], female    DOB: 06/10/1975, 42 y.o.   MRN: [id number]  HPI  Pt in [place] [facility] since Thursday. Nasal congestion and now sinus pain. Gets some colored mucus when she blows her nose.   Pt is coughing day and night. Cough is more dry. No wheezing.  No fever, no chills or sweats.  LMP- February 19, 2018.   Review of Systems  Constitutional: Negative for chills, fatigue and fever.  HENT: Positive for congestion, sinus pressure and sinus pain. Negative for ear pain and postnasal drip.   Respiratory: Positive for cough. Negative for choking, shortness of breath and wheezing.   Cardiovascular: Negative for chest pain and palpitations.  Gastrointestinal: Negative for abdominal pain, diarrhea and vomiting.  Musculoskeletal: Negative for back pain and myalgias.  Skin: Negative for rash.  Neurological: Negative for dizziness and headaches.  Hematological: Negative for adenopathy. Does not bruise/bleed easily.  Psychiatric/Behavioral: Negative for behavioral problems.   Past Medical History:  Diagnosis Date  . Allergic rhinitis    has seen ENT and allergy before  . Aorta coarctation    s/p correction 1983  . Birth control    Husband's vasectomy  . Dysplastic nevus 2016   midline inferior upper back  . Heart murmur   . History of shingles    mild during pregancy 2011  . Hyperlipidemia   . Palpable abd. aorta     normal Aorta and renal U/S 12-09     Social History   Socioeconomic History  . Marital status: Married    Spouse name: Not on file  . Number of children: 2  . Years of education: Not on file  . Highest education level: Not on file  Occupational History  . 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 Financial resource strain: Not on file  . Food insecurity:    Worry: Not on file    Inability: Not on file  . Transportation needs:    Medical: Not on file    Non-medical: Not on file  Tobacco Use  .  Smoking status: Never Smoker  . Smokeless tobacco: Never Used  Substance and Sexual Activity  . Alcohol use: Yes    Comment: Rare  . Drug use: No  . Sexual activity: Not on file  Lifestyle  . Physical activity:    Days per week: Not on file    Minutes per session: Not on file  . Stress: Not on file  Relationships  . Social connections:    Talks on phone: Not on file    Gets together: Not on file    Attends religious service: Not on file    Active member of club or organization: Not on file    Attends meetings of clubs or organizations: Not on file    Relationship status: Not on file  . Intimate partner violence:    Fear of current or ex partner: Not on file    Emotionally abused: Not on file    Physically abused: Not on file    Forced sexual activity: Not on file  Other Topics Concern  . Not on file  Social History Narrative   P4G2   2 children 2008, 2011    Past Surgical History:  Procedure Laterality Date  . BREAST SURGERY  1999   Benign nodule removed  . CESAREAN SECTION     x2  . COARCTATION OF AORTA REPAIR  [place] SURGERY  6160   complication from [facility]  . DILATION AND CURETTAGE OF UTERUS  2006    Family History  Problem Relation Age of Onset  . Hypertension Mother   . Hyperlipidemia Mother   . Hypothyroidism Mother   . Melanoma Father   . Diabetes Neg Hx   . Coronary artery disease Neg Hx   . Stroke Neg Hx   . Colon cancer Neg Hx   . Breast cancer Neg Hx     No Known Allergies  Current Outpatient Medications on File Prior to Visit  Medication Sig Dispense Refill  . azelastine (ASTELIN) 0.1 % nasal spray Place 2 sprays into both nostrils at bedtime as needed for rhinitis. Use in each nostril as directed 30 mL 12  . ELDERBERRY PO Take by mouth.    Marland Kitchen FLUoxetine (PROZAC) 20 MG tablet Take 1 tablet (20 mg total) by mouth daily. 90 tablet 3  . fluticasone (FLONASE) 50 MCG/ACT nasal spray Place 2 sprays into both nostrils daily. 16 g 12  .  loratadine (CLARITIN) 10 MG tablet Take 10 mg by mouth daily as needed for allergies.     No current facility-administered medications on file prior to visit.     BP (!) 145/68   Pulse 71   Temp 98.4 F (36.9 C) (Oral)   Resp 16   Ht 5\' 6"  (1.676 m)   Wt 146 lb 12.8 oz (66.6 kg)   SpO2 100%   BMI 23.69 kg/m       Objective:   Physical Exam  General  Mental Status - Alert. General Appearance - Well groomed. Not in acute distress.  Skin Rashes- No Rashes.  HEENT Head- Normal. Ear Auditory Canal - Left- Normal. Right - Normal.Tympanic Membrane- Left- Normal. Right- Normal. Eye Sclera/Conjunctiva- Left- Normal. Right- Normal. Nose & Sinuses Nasal Mucosa- Left-  Boggy and Congested. Right-  Boggy and  Congested.Bilateral maxillary and frontal sinus pressure. Mouth & Throat Lips: Upper Lip- Normal: no dryness, cracking, pallor, cyanosis, or vesicular eruption. Lower Lip-Normal: no dryness, cracking, pallor, cyanosis or vesicular eruption. Buccal Mucosa- Bilateral- No Aphthous ulcers. Oropharynx- No Discharge or Erythema. Tonsils: Characteristics- Bilateral- No Erythema or Congestion. Size/Enlargement- Bilateral- No enlargement. Discharge- bilateral-None.  Neck Neck- Supple. No Masses.   Chest and Lung Exam Auscultation: Breath Sounds:-Clear even and unlabored.  Cardiovascular Auscultation:Rythm- Regular, rate and rhythm. Murmurs & Other Heart Sounds:Ausculatation of the heart reveal- No Murmurs.  Lymphatic Head & Neck General Head & Neck Lymphatics: Bilateral: Description- No Localized lymphadenopathy.   .      Assessment & Plan:   You appear to have bronchitis and sinusitis(more sinus infection). Rest hydrate and tylenol for fever. I am prescribing cough medicine benzonatate, and benzonatate antibiotic. For your nasal congestion you could use flonase nasal steroid.   You should gradually get better. If not then notify us and would recommend a chest  xray.  Follow up in 7-10 days or as needed

## 2018-02-27 NOTE — Patient Instructions (Addendum)
You appear to have bronchitis and sinusitis(more sinus infection). Rest hydrate and tylenol for fever. I am prescribing cough medicine benzonatate, and benzonatate antibiotic. For your nasal congestion you could use flonase nasal steroid.   You should gradually get better. If not then notify us and would recommend a chest xray.  Follow up in 7-10 days or as needed

## 2018-03-01 DIAGNOSIS — Z0100 Encounter for examination of eyes and vision without abnormal findings: Secondary | ICD-10-CM | POA: Diagnosis not present

## 2018-03-02 DIAGNOSIS — M9903 Segmental and somatic dysfunction of lumbar region: Secondary | ICD-10-CM | POA: Diagnosis not present

## 2018-03-02 DIAGNOSIS — M9902 Segmental and somatic dysfunction of thoracic region: Secondary | ICD-10-CM | POA: Diagnosis not present

## 2018-03-02 DIAGNOSIS — M9905 Segmental and somatic dysfunction of pelvic region: Secondary | ICD-10-CM | POA: Diagnosis not present

## 2018-03-08 DIAGNOSIS — M9902 Segmental and somatic dysfunction of thoracic region: Secondary | ICD-10-CM | POA: Diagnosis not present

## 2018-03-08 DIAGNOSIS — M9905 Segmental and somatic dysfunction of pelvic region: Secondary | ICD-10-CM | POA: Diagnosis not present

## 2018-03-08 DIAGNOSIS — M9903 Segmental and somatic dysfunction of lumbar region: Secondary | ICD-10-CM | POA: Diagnosis not present

## 2018-03-13 DIAGNOSIS — M9903 Segmental and somatic dysfunction of lumbar region: Secondary | ICD-10-CM | POA: Diagnosis not present

## 2018-03-13 DIAGNOSIS — M9902 Segmental and somatic dysfunction of thoracic region: Secondary | ICD-10-CM | POA: Diagnosis not present

## 2018-03-13 DIAGNOSIS — M9905 Segmental and somatic dysfunction of pelvic region: Secondary | ICD-10-CM | POA: Diagnosis not present

## 2018-03-16 DIAGNOSIS — D1801 Hemangioma of skin and subcutaneous tissue: Secondary | ICD-10-CM | POA: Diagnosis not present

## 2018-03-16 DIAGNOSIS — D2261 Melanocytic nevi of right upper limb, including shoulder: Secondary | ICD-10-CM | POA: Diagnosis not present

## 2018-03-16 DIAGNOSIS — D225 Melanocytic nevi of trunk: Secondary | ICD-10-CM | POA: Diagnosis not present

## 2018-03-20 DIAGNOSIS — Z01419 Encounter for gynecological examination (general) (routine) without abnormal findings: Secondary | ICD-10-CM | POA: Diagnosis not present

## 2018-03-20 DIAGNOSIS — Z6823 Body mass index (BMI) 23.0-23.9, adult: Secondary | ICD-10-CM | POA: Diagnosis not present

## 2018-03-22 LAB — HM MAMMOGRAPHY

## 2018-03-23 DIAGNOSIS — M9905 Segmental and somatic dysfunction of pelvic region: Secondary | ICD-10-CM | POA: Diagnosis not present

## 2018-03-23 DIAGNOSIS — M9903 Segmental and somatic dysfunction of lumbar region: Secondary | ICD-10-CM | POA: Diagnosis not present

## 2018-03-23 DIAGNOSIS — M9902 Segmental and somatic dysfunction of thoracic region: Secondary | ICD-10-CM | POA: Diagnosis not present

## 2018-03-29 DIAGNOSIS — M9905 Segmental and somatic dysfunction of pelvic region: Secondary | ICD-10-CM | POA: Diagnosis not present

## 2018-03-29 DIAGNOSIS — M9903 Segmental and somatic dysfunction of lumbar region: Secondary | ICD-10-CM | POA: Diagnosis not present

## 2018-03-29 DIAGNOSIS — M9902 Segmental and somatic dysfunction of thoracic region: Secondary | ICD-10-CM | POA: Diagnosis not present

## 2018-04-19 DIAGNOSIS — M9902 Segmental and somatic dysfunction of thoracic region: Secondary | ICD-10-CM | POA: Diagnosis not present

## 2018-04-19 DIAGNOSIS — M9905 Segmental and somatic dysfunction of pelvic region: Secondary | ICD-10-CM | POA: Diagnosis not present

## 2018-04-19 DIAGNOSIS — M9903 Segmental and somatic dysfunction of lumbar region: Secondary | ICD-10-CM | POA: Diagnosis not present

## 2018-05-14 ENCOUNTER — Ambulatory Visit (INDEPENDENT_AMBULATORY_CARE_PROVIDER_SITE_OTHER): Payer: 59 | Admitting: Internal Medicine

## 2018-05-14 ENCOUNTER — Encounter: Payer: Self-pay | Admitting: Internal Medicine

## 2018-05-14 VITALS — BP 118/64 | HR 64 | Temp 98.4°F | Resp 16 | Ht 66.0 in | Wt 149.1 lb

## 2018-05-14 DIAGNOSIS — N951 Menopausal and female climacteric states: Secondary | ICD-10-CM | POA: Diagnosis not present

## 2018-05-14 DIAGNOSIS — J069 Acute upper respiratory infection, unspecified: Secondary | ICD-10-CM | POA: Diagnosis not present

## 2018-05-14 MED ORDER — BENZONATATE 200 MG PO CAPS: 200.0000 mg | ORAL_CAPSULE | Freq: Three times a day (TID) | ORAL | 0 refills | Status: DC | PRN

## 2018-05-14 MED ORDER — AMOXICILLIN 500 MG PO CAPS: 1000.0000 mg | ORAL_CAPSULE | Freq: Two times a day (BID) | ORAL | 0 refills | Status: DC

## 2018-05-14 NOTE — Patient Instructions (Addendum)
Rest, fluids , tylenol  For cough:  Take Mucinex DM twice a day as needed until better Also tessalon Perles as needed   For nasal congestion: Use OTC  Flonase : 2 nasal sprays on each side of the nose in the morning until you feel better Use ASTELIN a prescribed spray : 2 nasal sprays on each side of the nose at night until you feel better    Take the antibiotic as prescribed  (Amoxicillin) if no better in few days   Call if not gradually better over the next  10 days  Call anytime if the symptoms are severe

## 2018-05-14 NOTE — Progress Notes (Signed)
Pre visit review using our clinic review tool, if applicable. No additional management support is needed unless otherwise documented below in the visit note. 

## 2018-05-14 NOTE — Progress Notes (Signed)
Subjective:    Patient ID: [name], female    DOB: [birth year], 43 y.o.   MRN: [id number]  DOS:  05/14/2018 Type of visit - description: acute Symptoms started approximately 6 days ago with postnasal dripping, nose  congestion. 2 days ago also developed sinus pressure, cough which is dry. Her 29-year-old son has similar symptoms, no history of travel.   Review of Systems No fever or chills. Had mild sore throat she thinks due to postnasal dripping.  That is better. Still having some nasal discharge, clear to yellowish in color.  No chest congestion or wheezing to speak of.  Past Medical History:  Diagnosis Date  . Allergic rhinitis    has seen ENT and allergy before  . Aorta coarctation    s/p correction 1983  . Birth control    Husband's vasectomy  . Dysplastic nevus 2016   midline inferior upper back  . Heart murmur   . History of shingles    mild during pregancy 2011  . Hyperlipidemia   . Palpable abd. aorta     normal Aorta and renal U/S 12-09    Past Surgical History:  Procedure Laterality Date  . BREAST SURGERY  1999   Benign nodule removed  . CESAREAN SECTION     x2  . COARCTATION OF AORTA REPAIR  1983  . COLON SURGERY  9518   complication from [facility]  . DILATION AND CURETTAGE OF UTERUS  2006    Social History   Socioeconomic History  . Marital status: Married    Spouse name: Not on file  . Number of children: 2  . Years of education: Not on file  . Highest education level: Not on file  Occupational History  . 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 Financial resource strain: Not on file  . Food insecurity:    Worry: Not on file    Inability: Not on file  . Transportation needs:    Medical: Not on file    Non-medical: Not on file  Tobacco Use  . Smoking status: Never Smoker  . Smokeless tobacco: Never Used  Substance and Sexual Activity  . Alcohol use: Yes    Comment: Rare  . Drug use: No  . Sexual activity:  Not on file  Lifestyle  . Physical activity:    Days per week: Not on file    Minutes per session: Not on file  . Stress: Not on file  Relationships  . Social connections:    Talks on phone: Not on file    Gets together: Not on file    Attends religious service: Not on file    Active member of club or organization: Not on file    Attends meetings of clubs or organizations: Not on file    Relationship status: Not on file  . Intimate partner violence:    Fear of current or ex partner: Not on file    Emotionally abused: Not on file    Physically abused: Not on file    Forced sexual activity: Not on file  Other Topics Concern  . Not on file  Social History Narrative   P4G2   2 children 2008, 2011      Allergies as of 05/14/2018   No Known Allergies     Medication List       Accurate as of May 14, 2018 11:03 AM. Always use your most recent med list.  azelastine 0.1 % nasal spray Commonly known as:  ASTELIN Place 2 sprays into both nostrils at bedtime as needed for rhinitis. Use in each nostril as directed   benzonatate 100 MG capsule Commonly known as:  TESSALON Take 1 capsule (100 mg total) by mouth 3 (three) times daily as needed for cough.   doxycycline 100 MG tablet Commonly known as:  VIBRA-TABS Take 1 tablet (100 mg total) by mouth 2 (two) times daily. Can give caps or generic   ELDERBERRY PO Take by mouth.   FLUoxetine 20 MG tablet Commonly known as:  PROZAC Take 1 tablet (20 mg total) by mouth daily.   fluticasone 50 MCG/ACT nasal spray Commonly known as:  FLONASE Place 2 sprays into both nostrils daily.   loratadine 10 MG tablet Commonly known as:  CLARITIN Take 10 mg by mouth daily as needed for allergies.           Objective:   Physical Exam BP 118/64 (BP Location: Left Arm, Patient Position: Sitting, Cuff Size: Small)   Pulse 64   Temp 98.4 F (36.9 C) (Oral)   Resp 16   Ht 5\' 6"  (1.676 m)   Wt 149 lb 2 oz (67.6 kg)   LMP  05/06/2018 (Exact Date)   SpO2 96%   BMI 24.07 kg/m  General:   Well developed, NAD, BMI noted. HEENT:  Normocephalic . Face symmetric, atraumatic. TMs: Bulged bilaterally, worse on the right, minimal redness on the right?. Throat symmetric, no red. Sinuses: Slightly TTP symmetrically at the maxillary areas Lungs:  Very few rhonchi otherwise clear. Normal respiratory effort, no intercostal retractions, no accessory muscle use. Heart: RRR, systolic murmur noted.Marland Kitchen  No pretibial edema bilaterally  Skin: Not pale. Not jaundice Neurologic:  alert & oriented X3.  Speech normal, gait appropriate for age and unassisted Psych--  Cognition and judgment appear intact.  Cooperative with normal attention span and concentration.  Behavior appropriate. No anxious or depressed appearing.      Assessment      Assessment Hyperlipidemia Heart murmur Palpable aorta-- normal renal-aorta US 2009  Dysplastic nevus 2016, +FH melanoma-- sees derm q year Birth-control -- husband's  vasectomy  H/o Shingles 2011 Aorta coarctation, surgery 1983  Plan:  URI: As described above, recommend supportive treatment with Mucinex, Tessalon Perles, fluids, Flonase, Astelin.  She wonders about an antibiotic, no indication at this point but prescription was printed for amoxicillin to be used if she is not gradually better. Perimenopausal: Few months ago was switched to fluoxetine and that is doing a good job for her.

## 2018-05-15 NOTE — Assessment & Plan Note (Signed)
URI: As described above, recommend supportive treatment with Mucinex, Tessalon Perles, fluids, Flonase, Astelin.  She wonders about an antibiotic, no indication at this point but prescription was printed for amoxicillin to be used if she is not gradually better. Perimenopausal: Few months ago was switched to fluoxetine and that is doing a good job for her.

## 2018-05-17 ENCOUNTER — Encounter: Payer: Self-pay | Admitting: Internal Medicine

## 2018-07-20 ENCOUNTER — Ambulatory Visit: Payer: 59 | Admitting: Internal Medicine

## 2018-09-04 ENCOUNTER — Other Ambulatory Visit: Payer: Self-pay

## 2018-09-04 ENCOUNTER — Ambulatory Visit (INDEPENDENT_AMBULATORY_CARE_PROVIDER_SITE_OTHER): Payer: 59 | Admitting: Internal Medicine

## 2018-09-04 ENCOUNTER — Encounter: Payer: Self-pay | Admitting: Internal Medicine

## 2018-09-04 VITALS — BP 147/72 | HR 70 | Temp 98.8°F | Resp 16 | Ht 66.0 in | Wt 142.1 lb

## 2018-09-04 DIAGNOSIS — Z20828 Contact with and (suspected) exposure to other viral communicable diseases: Secondary | ICD-10-CM

## 2018-09-04 DIAGNOSIS — Z0000 Encounter for general adult medical examination without abnormal findings: Secondary | ICD-10-CM

## 2018-09-04 DIAGNOSIS — Z20822 Contact with and (suspected) exposure to covid-19: Secondary | ICD-10-CM

## 2018-09-04 MED ORDER — FLUOXETINE HCL 20 MG PO TABS: 20.0000 mg | ORAL_TABLET | Freq: Every day | ORAL | 3 refills | Status: DC

## 2018-09-04 MED ORDER — AZELASTINE HCL 0.1 % NA SOLN: 2.0000 | Freq: Every evening | NASAL | 12 refills | Status: DC | PRN

## 2018-09-04 MED ORDER — FLUTICASONE PROPIONATE 50 MCG/ACT NA SUSP: 2.0000 | Freq: Every day | NASAL | 12 refills | Status: DC

## 2018-09-04 NOTE — Progress Notes (Signed)
Subjective:    Patient ID: [name], female    DOB: 1975/04/24, 43 y.o.   MRN: [id number]  DOS:  09/04/2018 Type of visit - description: Complete physical exam Doing well, has 2 children at home, some stress related to the quarantine but overall doing well.   Review of Systems  Other than above, a 14 point review of systems is negative      Past Medical History:  Diagnosis Date  . Allergic rhinitis    has seen ENT and allergy before  . Aorta coarctation    s/p correction 1983  . Birth control    Husband's vasectomy  . Dysplastic nevus 2016   midline inferior upper back  . Heart murmur   . History of shingles    mild during pregancy 2011  . Hyperlipidemia   . Palpable abd. aorta     normal Aorta and renal U/S 12-09    Past Surgical History:  Procedure Laterality Date  . BREAST SURGERY  1999   Benign nodule removed  . CESAREAN SECTION     x2  . COARCTATION OF AORTA REPAIR  1983  . COLON SURGERY  0349   complication from [facility]  . DILATION AND CURETTAGE OF UTERUS  2006    Social History   Socioeconomic History  . Marital status: Married    Spouse name: Not on file  . Number of children: 2  . Years of education: Not on file  . Highest education level: Not on file  Occupational History  . 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 Financial resource strain: Not on file  . Food insecurity    Worry: Not on file    Inability: Not on file  . Transportation needs    Medical: Not on file    Non-medical: Not on file  Tobacco Use  . Smoking status: Never Smoker  . Smokeless tobacco: Never Used  Substance and Sexual Activity  . Alcohol use: Yes    Comment: Rare  . Drug use: No  . Sexual activity: Not on file  Lifestyle  . Physical activity    Days per week: Not on file    Minutes per session: Not on file  . Stress: Not on file  Relationships  . Social Herbalist on phone: Not on file    Gets together: Not on file     Attends religious service: Not on file    Active member of club or organization: Not on file    Attends meetings of clubs or organizations: Not on file    Relationship status: Not on file  . Intimate partner violence    Fear of current or ex partner: Not on file    Emotionally abused: Not on file    Physically abused: Not on file    Forced sexual activity: Not on file  Other Topics Concern  . Not on file  Social History Narrative   P4G2   2 children 2008, 2011     Family History  Problem Relation Age of Onset  . Hypertension Mother   . Hyperlipidemia Mother   . Hypothyroidism Mother   . Melanoma Father   . Diabetes Neg Hx   . Coronary artery disease Neg Hx   . Stroke Neg Hx   . Colon cancer Neg Hx   . Breast cancer Neg Hx      Allergies as of 09/04/2018   No Known  Allergies     Medication List       Accurate as of September 04, 2018  9:33 PM. If you have any questions, ask your nurse or doctor.        STOP taking these medications   amoxicillin 500 MG capsule Commonly known as: AMOXIL Stopped by: Kathlene November, MD   benzonatate 200 MG capsule Commonly known as: TESSALON Stopped by: Kathlene November, MD     TAKE these medications   azelastine 0.1 % nasal spray Commonly known as: ASTELIN Place 2 sprays into both nostrils at bedtime as needed for rhinitis. Use in each nostril as directed   ELDERBERRY PO Take by mouth.   FLUoxetine 20 MG tablet Commonly known as: PROZAC Take 1 tablet (20 mg total) by mouth daily.   fluticasone 50 MCG/ACT nasal spray Commonly known as: FLONASE Place 2 sprays into both nostrils daily.   loratadine 10 MG tablet Commonly known as: CLARITIN Take 10 mg by mouth daily as needed for allergies.           Objective:   Physical Exam BP (!) 147/72 (BP Location: Left Arm, Patient Position: Sitting, Cuff Size: Small)   Pulse 70   Temp 98.8 F (37.1 C) (Oral)   Resp 16   Ht 5\' 6"  (1.676 m)   Wt 142 lb 2 oz (64.5 kg)   SpO2 99%   BMI  22.94 kg/m   General: Well developed, NAD, BMI noted Neck: No  thyromegaly  HEENT:  Normocephalic . Face symmetric, atraumatic Lungs:  CTA B Normal respiratory effort, no intercostal retractions, no accessory muscle use. Heart: RRR, + systolic murmur.  No pretibial edema bilaterally  Abdomen:  Not distended, soft, non-tender. No rebound or rigidity.   Skin: Exposed areas without rash. Not pale. Not jaundice Neurologic:  alert & oriented X3.  Speech normal, gait appropriate for age and unassisted Strength symmetric and appropriate for age.  Psych: Cognition and judgment appear intact.  Cooperative with normal attention span and concentration.  Behavior appropriate. No anxious or depressed appearing.     Assessment     Assessment Hyperlipidemia CV: -Heart murmur -Aorta coarctation, surgery 1983 -Palpable aorta-- normal renal-aorta US 2009  Dysplastic nevus 2016, +FH melanoma-- sees derm q year Birth-control -- husband's  vasectomy  H/o Shingles 2011   Plan: Hyperlipidemia: Diet controlled, check labs Cardiovascular: Next visit with cardiology 2021.  She is very active, exercising more, no CV symptoms Dysplastic nevus: Sees dermatology regularly Request COVID-19 IgG, had a viral illness few months ago, I agreed to check, she is very worried. Even if IGG + still needs to continue with a strict COVID-19 protocols. RTC 1 year

## 2018-09-04 NOTE — Assessment & Plan Note (Signed)
  Hyperlipidemia: Diet controlled, check labs Cardiovascular: Next visit with cardiology 2021.  She is very active, exercising more, no CV symptoms Dysplastic nevus: Sees dermatology regularly Request COVID-19 IgG, had a viral illness few months ago, I agreed to check, she is very worried. Even if IGG + still needs to continue with a strict COVID-19 protocols. RTC 1 year

## 2018-09-04 NOTE — Progress Notes (Signed)
Pre visit review using our clinic review tool, if applicable. No additional management support is needed unless otherwise documented below in the visit note. 

## 2018-09-04 NOTE — Patient Instructions (Signed)
Get the blood work     [place] Schedule your next appointment   fpr a physical exam in 1 year   Get a early flu shot this year

## 2018-09-04 NOTE — Assessment & Plan Note (Addendum)
--  Td-2013; rec early flu shot for 2020 --Female care per  gyn (Dr Helane Rima)   --CCS: never had a Cscope  --Labs: CMP, FLP, CBC, COVID-19 IgG --Lifestyle discussed, doing well

## 2018-09-05 LAB — CBC WITH DIFFERENTIAL/PLATELET
Basophils Absolute: 0.1 10*3/uL (ref 0.0–0.1)
Basophils Relative: 1 % (ref 0.0–3.0)
Eosinophils Absolute: 0.2 10*3/uL (ref 0.0–0.7)
Eosinophils Relative: 2.9 % (ref 0.0–5.0)
HCT: 41.5 % (ref 36.0–46.0)
Hemoglobin: 13.7 g/dL (ref 12.0–15.0)
Lymphocytes Relative: 29 % (ref 12.0–46.0)
Lymphs Abs: 2 10*3/uL (ref 0.7–4.0)
MCHC: 33.1 g/dL (ref 30.0–36.0)
MCV: 86.3 fl (ref 78.0–100.0)
Monocytes Absolute: 0.3 10*3/uL (ref 0.1–1.0)
Monocytes Relative: 4.4 % (ref 3.0–12.0)
Neutro Abs: 4.3 10*3/uL (ref 1.4–7.7)
Neutrophils Relative %: 62.7 % (ref 43.0–77.0)
Platelets: 309 10*3/uL (ref 150.0–400.0)
RBC: 4.81 Mil/uL (ref 3.87–5.11)
RDW: 13.1 % (ref 11.5–15.5)
WBC: 6.9 10*3/uL (ref 4.0–10.5)

## 2018-09-05 LAB — COMPREHENSIVE METABOLIC PANEL
ALT: 15 U/L (ref 0–35)
AST: 18 U/L (ref 0–37)
Albumin: 4.7 g/dL (ref 3.5–5.2)
Alkaline Phosphatase: 39 U/L (ref 39–117)
BUN: 14 mg/dL (ref 6–23)
CO2: 25 mEq/L (ref 19–32)
Calcium: 9.5 mg/dL (ref 8.4–10.5)
Chloride: 103 mEq/L (ref 96–112)
Creatinine, Ser: 0.98 mg/dL (ref 0.40–1.20)
GFR: 61.47 mL/min (ref >60.00)
Glucose, Bld: 90 mg/dL (ref 70–99)
Potassium: 4.4 mEq/L (ref 3.5–5.1)
Sodium: 137 mEq/L (ref 135–145)
Total Bilirubin: 0.4 mg/dL (ref 0.2–1.2)
Total Protein: 7 g/dL (ref 6.0–8.3)

## 2018-09-05 LAB — LIPID PANEL
Cholesterol: 225 mg/dL — ABNORMAL HIGH (ref 0–200)
HDL: 72 mg/dL (ref >39.00)
LDL Cholesterol: 139 mg/dL — ABNORMAL HIGH (ref 0–99)
NonHDL: 153.25
Total CHOL/HDL Ratio: 3
Triglycerides: 73 mg/dL (ref 0.0–149.0)
VLDL: 14.6 mg/dL (ref 0.0–40.0)

## 2018-09-05 LAB — SAR COV2 SEROLOGY (COVID19)AB(IGG),IA: SARS CoV2 AB IGG: NEGATIVE

## 2018-11-22 ENCOUNTER — Other Ambulatory Visit: Payer: Self-pay

## 2018-11-22 ENCOUNTER — Ambulatory Visit (INDEPENDENT_AMBULATORY_CARE_PROVIDER_SITE_OTHER): Payer: 59

## 2018-11-22 DIAGNOSIS — Z23 Encounter for immunization: Secondary | ICD-10-CM

## 2018-11-22 NOTE — Progress Notes (Signed)
Flu shot given to patient w/o any complications 

## 2019-03-25 LAB — HM PAP SMEAR: HM Pap smear: NORMAL

## 2019-03-25 LAB — HM MAMMOGRAPHY

## 2019-03-28 ENCOUNTER — Other Ambulatory Visit: Payer: Self-pay | Admitting: Obstetrics and Gynecology

## 2019-03-28 DIAGNOSIS — R928 Other abnormal and inconclusive findings on diagnostic imaging of breast: Secondary | ICD-10-CM

## 2019-04-05 ENCOUNTER — Other Ambulatory Visit: Payer: Self-pay

## 2019-04-05 ENCOUNTER — Ambulatory Visit: Payer: 59

## 2019-04-05 ENCOUNTER — Ambulatory Visit
Admission: RE | Admit: 2019-04-05 | Discharge: 2019-04-05 | Disposition: A | Discharge status: Home / Self Care | Payer: 59 | Source: Ambulatory Visit | Attending: Obstetrics and Gynecology | Admitting: Obstetrics and Gynecology

## 2019-04-05 DIAGNOSIS — R928 Other abnormal and inconclusive findings on diagnostic imaging of breast: Secondary | ICD-10-CM

## 2019-07-10 ENCOUNTER — Encounter: Payer: Self-pay | Admitting: Internal Medicine

## 2019-07-24 NOTE — Progress Notes (Signed)
HPI: FU coarctation of the aorta. Patient is status post repair of coarctation in 17. Abdominal ultrasound in December of 2009 showed a normal abdominal aorta, normal renal arteries. Echocardiogram in May 2012 showed normal LV function. The aortic valve is trileaflet with no stenosis or regurgitation. CTA June 2018 showed changes consistent with prior repair of coarctation of the aorta with mild residual narrowing.  Since I last saw her the patient denies any dyspnea on exertion, orthopnea, PND, pedal edema, palpitations, syncope or chest pain.   Current Outpatient Medications  Medication Sig Dispense Refill  . azelastine (ASTELIN) 0.1 % nasal spray Place 2 sprays into both nostrils at bedtime as needed for rhinitis. Use in each nostril as directed 30 mL 12  . ELDERBERRY PO Take by mouth.    Marland Kitchen FLUoxetine (PROZAC) 20 MG tablet Take 1 tablet (20 mg total) by mouth daily. 90 tablet 3  . fluticasone (FLONASE) 50 MCG/ACT nasal spray Place 2 sprays into both nostrils daily. 16 g 12   No current facility-administered medications for this visit.     Past Medical History:  Diagnosis Date  . Allergic rhinitis    has seen ENT and allergy before  . Aorta coarctation    s/p correction 1983  . Birth control    Husband's vasectomy  . Dysplastic nevus 2016   midline inferior upper back  . Heart murmur   . History of shingles    mild during pregancy 2011  . Hyperlipidemia   . Palpable abd. aorta     normal Aorta and renal U/S 12-09    Past Surgical History:  Procedure Laterality Date  . BREAST SURGERY  1999   Benign nodule removed  . CESAREAN SECTION     x2  . COARCTATION OF AORTA REPAIR  1983  . COLON SURGERY  [id number]   com[facility] from [facility]  . DILATION AND CURETTAGE OF UTERUS  2006    Social History   Socioeconomic History  . Marital status: Married    Spouse name: Not on file  . Number of children: 2  . Years of education: Not on file  . Highest education level: Not on  file  Occupational History  . 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 Smoking status: Never Smoker  . Smokeless tobacco: Never Used  Substance and Sexual Activity  . Alcohol use: Yes    Comment: Rare  . Drug use: No  . Sexual activity: Not on file  Other Topics Concern  . Not on file  Social History Narrative   P4G2   2 children 2008, 2011   Social Determinants of Health   Financial Resource Strain:   . Difficulty of Paying Living Expenses:   Food Insecurity:   . Worried About Charity fundraiser in the Last Year:   . Arboriculturist in the Last Year:   Transportation Needs:   . Film/video editor (Medical):   Marland Kitchen Lack of Transportation (Non-Medical):   Physical Activity:   . Days of Exercise per Week:   . Minutes of Exercise per Session:   Stress:   . Feeling of Stress :   Social Connections:   . Frequency of Communication with Friends and Family:   . Frequency of Social Gatherings with Friends and Family:   . Attends Religious Services:   . Active Member of Clubs or Organizations:   . Attends Archivist Meetings:   .  Marital Status:   Intimate Partner Violence:   . Fear of Current or Ex-Partner:   . Emotionally Abused:   Marland Kitchen Physically Abused:   . Sexually Abused:     Family History  Problem Relation Age of Onset  . Hypertension Mother   . Hyperlipidemia Mother   . Hypothyroidism Mother   . Melanoma Father   . Diabetes Neg Hx   . Coronary artery disease Neg Hx   . Stroke Neg Hx   . Colon cancer Neg Hx   . Breast cancer Neg Hx     ROS: no fevers or chills, productive cough, hemoptysis, dysphasia, odynophagia, melena, hematochezia, dysuria, hematuria, rash, seizure activity, orthopnea, PND, pedal edema, claudication. Remaining systems are negative.  Physical Exam: Blood pressure right arm 140/70 and left arm 130/66. Well-developed well-nourished in no acute distress.  Skin is warm and dry.  HEENT is normal.    Neck is supple.  Chest is clear to auscultation with normal expansion.  Cardiovascular exam is regular rate and rhythm.  2/6 systolic murmur left sternal border.  No diastolic murmur. Abdominal exam nontender or distended. No masses palpated. Extremities show no edema. neuro grossly intact  ECG-sinus rhythm at a rate of 65, right axis deviation, cannot rule out septal infarct.  Personally reviewed  A/P  1 coarctation of the aorta-status post repair.  We will plan repeat CTA.  2 murmur-previous echocardiogram showed trileaflet aortic valve with no AS or AI.  We will repeat.  Kirk Ruths, MD

## 2019-07-31 ENCOUNTER — Encounter: Payer: Self-pay | Admitting: Cardiology

## 2019-07-31 ENCOUNTER — Ambulatory Visit (INDEPENDENT_AMBULATORY_CARE_PROVIDER_SITE_OTHER): Payer: 59 | Admitting: Cardiology

## 2019-07-31 ENCOUNTER — Other Ambulatory Visit: Payer: Self-pay

## 2019-07-31 VITALS — BP 132/76 | HR 65 | Ht 64.5 in | Wt 134.8 lb

## 2019-07-31 DIAGNOSIS — R011 Cardiac murmur, unspecified: Secondary | ICD-10-CM | POA: Diagnosis not present

## 2019-07-31 DIAGNOSIS — Q251 Coarctation of aorta: Secondary | ICD-10-CM | POA: Diagnosis not present

## 2019-07-31 NOTE — Patient Instructions (Signed)
Medication Instructions:  NO CHANGE *If you need a refill on your cardiac medications before your next appointment, please call your pharmacy*   Lab Work: If you have labs (blood work) drawn today and your tests are completely normal, you will receive your results only by: Marland Kitchen MyChart Message (if you have MyChart) OR . A paper copy in the mail If you have any lab test that is abnormal or we need to change your treatment, we will call you to review the results.   Testing/Procedures:  CTA OF THE CHEST AT THE [facility]  Your physician has requested that you have an echocardiogram. Echocardiography is a painless test that uses sound waves to create images of your heart. It provides your doctor with information about the size and shape of your heart and how well your heart's chambers and valves are working. This procedure takes approximately one hour. There are no restrictions for this procedure.[facility]   Follow-Up: At [facility], you and your health needs are our priority.  As part of our continuing mission to provide you with exceptional heart care, we have created designated Provider Care Teams.  These Care Teams include your primary Cardiologist (physician) and Advanced Practice Providers (APPs -  Physician Assistants and Nurse Practitioners) who all work together to provide you with the care you need, when you need it.  We recommend signing up for the patient portal called "MyChart".  Sign up information is provided on this After Visit Summary.  MyChart is used to connect with patients for Virtual Visits (Telemedicine).  Patients are able to view lab/test results, encounter notes, upcoming appointments, etc.  Non-urgent messages can be sent to your provider as well.   To learn more about what you can do with MyChart, go to [email or web address].    Your next appointment:   3 year(s)  The format for your next appointment:   In Person  Provider:   Kirk Ruths,  MD

## 2019-08-19 ENCOUNTER — Ambulatory Visit (HOSPITAL_BASED_OUTPATIENT_CLINIC_OR_DEPARTMENT_OTHER)
Admission: RE | Admit: 2019-08-19 | Discharge: 2019-08-19 | Disposition: A | Discharge status: Home / Self Care | Payer: 59 | Source: Ambulatory Visit | Attending: Cardiology | Admitting: Cardiology

## 2019-08-19 ENCOUNTER — Other Ambulatory Visit: Payer: Self-pay

## 2019-08-19 ENCOUNTER — Encounter (HOSPITAL_BASED_OUTPATIENT_CLINIC_OR_DEPARTMENT_OTHER): Payer: Self-pay

## 2019-08-19 DIAGNOSIS — Q251 Coarctation of aorta: Secondary | ICD-10-CM

## 2019-08-19 DIAGNOSIS — R011 Cardiac murmur, unspecified: Secondary | ICD-10-CM | POA: Diagnosis not present

## 2019-08-19 MED ORDER — IOHEXOL 350 MG/ML SOLN
100.0000 mL | Freq: Once | INTRAVENOUS | Status: AC | PRN
  Administered 2019-08-19: 100 mL via INTRAVENOUS

## 2019-08-19 NOTE — Progress Notes (Signed)
  Echocardiogram 2D Echocardiogram has been performed.  [name] 08/19/2019, 10:33 AM

## 2019-09-06 ENCOUNTER — Other Ambulatory Visit: Payer: Self-pay

## 2019-09-06 ENCOUNTER — Encounter: Payer: Self-pay | Admitting: Internal Medicine

## 2019-09-06 ENCOUNTER — Ambulatory Visit (INDEPENDENT_AMBULATORY_CARE_PROVIDER_SITE_OTHER): Payer: 59 | Admitting: Internal Medicine

## 2019-09-06 VITALS — BP 137/84 | HR 58 | Temp 98.2°F | Resp 16 | Ht 65.0 in | Wt 133.5 lb

## 2019-09-06 DIAGNOSIS — Z0000 Encounter for general adult medical examination without abnormal findings: Secondary | ICD-10-CM | POA: Diagnosis not present

## 2019-09-06 LAB — CBC WITH DIFFERENTIAL/PLATELET
Basophils Absolute: 0 10*3/uL (ref 0.0–0.1)
Basophils Relative: 0.6 % (ref 0.0–3.0)
Eosinophils Absolute: 0.1 10*3/uL (ref 0.0–0.7)
Eosinophils Relative: 2.6 % (ref 0.0–5.0)
HCT: 43.6 % (ref 36.0–46.0)
Hemoglobin: 14.4 g/dL (ref 12.0–15.0)
Lymphocytes Relative: 27.2 % (ref 12.0–46.0)
Lymphs Abs: 1.5 10*3/uL (ref 0.7–4.0)
MCHC: 33 g/dL (ref 30.0–36.0)
MCV: 87.7 fl (ref 78.0–100.0)
Monocytes Absolute: 0.3 10*3/uL (ref 0.1–1.0)
Monocytes Relative: 5.8 % (ref 3.0–12.0)
Neutro Abs: 3.5 10*3/uL (ref 1.4–7.7)
Neutrophils Relative %: 63.8 % (ref 43.0–77.0)
Platelets: 294 10*3/uL (ref 150.0–400.0)
RBC: 4.96 Mil/uL (ref 3.87–5.11)
RDW: 14.4 % (ref 11.5–15.5)
WBC: 5.5 10*3/uL (ref 4.0–10.5)

## 2019-09-06 LAB — COMPREHENSIVE METABOLIC PANEL
ALT: 22 U/L (ref 0–35)
AST: 23 U/L (ref 0–37)
Albumin: 5.1 g/dL (ref 3.5–5.2)
Alkaline Phosphatase: 40 U/L (ref 39–117)
BUN: 14 mg/dL (ref 6–23)
CO2: 26 mEq/L (ref 19–32)
Calcium: 10.3 mg/dL (ref 8.4–10.5)
Chloride: 100 mEq/L (ref 96–112)
Creatinine, Ser: 0.83 mg/dL (ref 0.40–1.20)
GFR: 74.13 mL/min (ref >60.00)
Glucose, Bld: 83 mg/dL (ref 70–99)
Potassium: 5 mEq/L (ref 3.5–5.1)
Sodium: 136 mEq/L (ref 135–145)
Total Bilirubin: 0.9 mg/dL (ref 0.2–1.2)
Total Protein: 7.4 g/dL (ref 6.0–8.3)

## 2019-09-06 LAB — LIPID PANEL
Cholesterol: 239 mg/dL — ABNORMAL HIGH (ref 0–200)
HDL: 81.4 mg/dL (ref >39.00)
LDL Cholesterol: 145 mg/dL — ABNORMAL HIGH (ref 0–99)
NonHDL: 157.15
Total CHOL/HDL Ratio: 3
Triglycerides: 62 mg/dL (ref 0.0–149.0)
VLDL: 12.4 mg/dL (ref 0.0–40.0)

## 2019-09-06 LAB — TSH: TSH: 1.79 u[IU]/mL (ref 0.35–4.50)

## 2019-09-06 NOTE — Progress Notes (Signed)
Subjective:    Patient ID: [name], female    DOB: 10/18/1975, 44 y.o.   MRN: [id number]  DOS:  09/06/2019 Type of visit - description: CPX Feels well, has no concerns, saw cardiology, notes reviewed.  Review of Systems   A 14 point review of systems is negative    Past Medical History:  Diagnosis Date  . Allergic rhinitis    has seen ENT and allergy before  . Aorta coarctation    s/p correction 1983  . Birth control    Husband's vasectomy  . Dysplastic nevus 2016   midline inferior upper back  . Heart murmur   . History of shingles    mild during pregancy 2011  . Hyperlipidemia   . Palpable abd. aorta     normal Aorta and renal U/S 12-09    Past Surgical History:  Procedure Laterality Date  . BREAST SURGERY  1999   Benign nodule removed  . CESAREAN SECTION     x2  . COARCTATION OF AORTA REPAIR  1983  . COLON SURGERY  3810   complication from [facility]  . DILATION AND CURETTAGE OF UTERUS  2006    Allergies as of 09/06/2019   No Known Allergies     Medication List       Accurate as of September 06, 2019 11:59 PM. If you have any questions, ask your nurse or doctor.        azelastine 0.1 % nasal spray Commonly known as: ASTELIN Place 2 sprays into both nostrils at bedtime as needed for rhinitis. Use in each nostril as directed   cholecalciferol 25 MCG (1000 UNIT) tablet Commonly known as: VITAMIN D3 Take 1,000 Units by mouth daily.   ELDERBERRY PO Take by mouth.   FLUoxetine 20 MG tablet Commonly known as: PROZAC Take 1 tablet (20 mg total) by mouth daily.   fluticasone 50 MCG/ACT nasal spray Commonly known as: FLONASE Place 2 sprays into both nostrils daily.          Objective:   Physical Exam BP 137/84 (BP Location: Left Arm, Patient Position: Sitting, Cuff Size: Small)   Pulse (!) 58   Temp 98.2 F (36.8 C) (Temporal)   Resp 16   Ht 5\' 5"  (1.651 m)   Wt 133 lb 8 oz (60.6 kg)   LMP 08/20/2019 (Approximate)   SpO2 100%   BMI 22.22 kg/m    General: Well developed, NAD, BMI noted Neck: No  thyromegaly  HEENT:  Normocephalic . Face symmetric, atraumatic Lungs:  CTA B Normal respiratory effort, no intercostal retractions, no accessory muscle use. Heart: RRR, soft systolic murmur.  Abdomen:  Not distended, soft, non-tender. No rebound or rigidity.  Palpable aorta, not tender. Lower extremities: no pretibial edema bilaterally  Skin: Exposed areas without rash. Not pale. Not jaundice Neurologic:  alert & oriented X3.  Speech normal, gait appropriate for age and unassisted Strength symmetric and appropriate for age.  Psych: Cognition and judgment appear intact.  Cooperative with normal attention span and concentration.  Behavior appropriate. No anxious or depressed appearing.      Assessment    Assessment Hyperlipidemia CV: -Heart murmur -Aorta coarctation, surgery 1983 -Palpable aorta-- normal renal-aorta US 2009  Dysplastic nevus 2016, +FH melanoma-- sees derm q year Birth-control -- husband's  vasectomy  H/o Shingles 2011   PLAN Here for CPX Hyperlipidemia: Diet controlled, doing well CV: Last visit with cardiology 07/2019, CT aorta showed stable repair, had a echo with normal EF  Dysplastic nevus: Sees dermatology RTC 1 year   This visit occurred during the SARS-CoV-2 public health emergency.  Safety protocols were in place, including screening questions prior to the visit, additional usage of staff PPE, and extensive cleaning of exam room while observing appropriate contact time as indicated for disinfecting solutions.

## 2019-09-06 NOTE — Progress Notes (Signed)
Pre visit review using our clinic review tool, if applicable. No additional management support is needed unless otherwise documented below in the visit note. 

## 2019-09-06 NOTE — Patient Instructions (Signed)
Check the  blood pressure monthly BP GOAL is between 110/65 and  135/85. If it is consistently higher or lower, let me know   GO TO THE LAB : Get the blood work     GO TO THE FRONT DESK, PLEASE SCHEDULE YOUR APPOINTMENTS Come back for   a physical exam in 1 year 

## 2019-09-08 NOTE — Assessment & Plan Note (Signed)
Here for CPX Hyperlipidemia: Diet controlled, doing well CV: Last visit with cardiology 07/2019, CT aorta showed stable repair, had a echo with normal EF Dysplastic nevus: Sees dermatology RTC 1 year

## 2019-09-08 NOTE — Assessment & Plan Note (Signed)
--  Td-2013 -S/p Covid shots -Recommend flu shot yearly --Female care per  gyn (Dr Helane Rima), menses are monthly, not heavy , PAP & MMG 03/2019 --CCS: never had a Cscope  --Labs:  CMP, FLP, CBC, TSH --Lifestyle discussed, doing well

## 2019-09-09 ENCOUNTER — Other Ambulatory Visit: Payer: Self-pay | Admitting: Internal Medicine

## 2019-12-05 ENCOUNTER — Other Ambulatory Visit: Payer: Self-pay

## 2019-12-05 ENCOUNTER — Ambulatory Visit (INDEPENDENT_AMBULATORY_CARE_PROVIDER_SITE_OTHER): Payer: 59

## 2019-12-05 DIAGNOSIS — Z23 Encounter for immunization: Secondary | ICD-10-CM | POA: Diagnosis not present

## 2020-01-08 ENCOUNTER — Ambulatory Visit (INDEPENDENT_AMBULATORY_CARE_PROVIDER_SITE_OTHER): Payer: 59 | Admitting: Family

## 2020-01-08 ENCOUNTER — Other Ambulatory Visit: Payer: Self-pay

## 2020-01-08 VITALS — BP 148/87 | HR 65 | Temp 98.3°F | Resp 16 | Ht 64.0 in | Wt 140.0 lb

## 2020-01-08 DIAGNOSIS — S90122A Contusion of left lesser toe(s) without damage to nail, initial encounter: Secondary | ICD-10-CM | POA: Diagnosis not present

## 2020-01-08 NOTE — Progress Notes (Signed)
Subjective:    Patient ID: [name], female    DOB: [birth year], 44 y.o.   MRN: [id number]  HPI  Patient is a 44 yr old female who presents today with c/o left foot pain. She reports that she stepped in a plastic bin on Sunday and developed pain in the left middle toe.  She is having some pain with walking. She typically runs, but has been avoiding running until she had it looked at.    Review of Systems    see HPI  Past Medical History:  Diagnosis Date  . Allergic rhinitis    has seen ENT and allergy before  . Aorta coarctation    s/p correction 1983  . Birth control    Husband's vasectomy  . Dysplastic nevus 2016   midline inferior upper back  . Heart murmur   . History of shingles    mild during pregancy 2011  . Hyperlipidemia   . Palpable abd. aorta     normal Aorta and renal U/S 12-09     Social History   Socioeconomic History  . Marital status: Married    Spouse name: Not on file  . Number of children: 2  . Years of education: Not on file  . Highest education level: Not on file  Occupational History  . 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 Smoking status: Never Smoker  . Smokeless tobacco: Never Used  Substance and Sexual Activity  . Alcohol use: Yes    Comment: Rare  . Drug use: No  . Sexual activity: Not on file  Other Topics Concern  . Not on file  Social History Narrative   P4G2   2 children 2008, 2011   Social Determinants of Health   Financial Resource Strain:   . Difficulty of Paying Living Expenses: Not on file  Food Insecurity:   . Worried About Charity fundraiser in the Last Year: Not on file  . Ran Out of Food in the Last Year: Not on file  Transportation Needs:   . Lack of Transportation (Medical): Not on file  . Lack of Transportation (Non-Medical): Not on file  Physical Activity:   . Days of Exercise per Week: Not on file  . Minutes of Exercise per Session: Not on file  Stress:   . Feeling of  Stress : Not on file  Social Connections:   . Frequency of Communication with Friends and Family: Not on file  . Frequency of Social Gatherings with Friends and Family: Not on file  . Attends Religious Services: Not on file  . Active Member of Clubs or Organizations: Not on file  . Attends Archivist Meetings: Not on file  . Marital Status: Not on file  Intimate Partner Violence:   . Fear of Current or Ex-Partner: Not on file  . Emotionally Abused: Not on file  . Physically Abused: Not on file  . Sexually Abused: Not on file    Past Surgical History:  Procedure Laterality Date  . BREAST SURGERY  1999   Benign nodule removed  . CESAREAN SECTION     x2  . COARCTATION OF AORTA REPAIR  1983  . COLON SURGERY  4782   complication from [facility]  . DILATION AND CURETTAGE OF UTERUS  2006    Family History  Problem Relation Age of Onset  . Hypertension Mother   . Hyperlipidemia Mother   . Hypothyroidism Mother   .  Melanoma Father   . Diabetes Neg Hx   . Coronary artery disease Neg Hx   . Stroke Neg Hx   . Colon cancer Neg Hx   . Breast cancer Neg Hx     No Known Allergies  Current Outpatient Medications on File Prior to Visit  Medication Sig Dispense Refill  . azelastine (ASTELIN) 0.1 % nasal spray Place 2 sprays into both nostrils at bedtime as needed for rhinitis or allergies. Use in each nostril as directed 30 mL 12  . cholecalciferol (VITAMIN D3) 25 MCG (1000 UNIT) tablet Take 1,000 Units by mouth daily.    Marland Kitchen ELDERBERRY PO Take by mouth.    Marland Kitchen FLUoxetine (PROZAC) 20 MG tablet Take 1 tablet (20 mg total) by mouth daily. 90 tablet 3  . fluticasone (FLONASE) 50 MCG/ACT nasal spray Place 2 sprays into both nostrils daily. 16 g 12   No current facility-administered medications on file prior to visit.    BP (!) 148/87 (BP Location: Right Arm, Patient Position: Sitting, Cuff Size: Small)   Pulse 65   Temp 98.3 F (36.8 C) (Oral)   Resp 16   Ht 5\' 4"  (1.626 m)   Wt  140 lb (63.5 kg)   SpO2 100%   BMI 24.03 kg/m    Objective:   Physical Exam Constitutional:      Appearance: She is well-developed.  Pulmonary:     Effort: Pulmonary effort is normal. No respiratory distress.     Breath sounds: No wheezing.  Musculoskeletal:     Comments: + ecchymosis noted at the base of the nailbed of left middle toe Mild tenderness of distal middle toe No significant swelling    Psychiatric:        Behavior: Behavior normal.        Thought Content: Thought content normal.        Judgment: Judgment normal.           Assessment & Plan:  Contusion of toe- Clinically, I doubt fracture but advised pt as follows:  Please "buddy tape" your toe until it is feeling better.  Avoid running until you are pain free.  Call if increased/pain, swelling or if pain does not improve in the next few weeks.   This visit occurred during the SARS-CoV-2 public health emergency.  Safety protocols were in place, including screening questions prior to the visit, additional usage of staff PPE, and extensive cleaning of exam room while observing appropriate contact time as indicated for disinfecting solutions.

## 2020-01-08 NOTE — Patient Instructions (Addendum)
Please "buddy tape" your toe until it is feeling better.  Avoid running until you are pain free.  Call if increased/pain, swelling or if pain does not improve in the next few weeks.

## 2020-02-16 ENCOUNTER — Other Ambulatory Visit: Payer: Self-pay | Admitting: Internal Medicine

## 2020-03-25 LAB — HM MAMMOGRAPHY

## 2020-06-17 ENCOUNTER — Encounter: Payer: Self-pay | Admitting: Internal Medicine

## 2020-09-10 ENCOUNTER — Ambulatory Visit (INDEPENDENT_AMBULATORY_CARE_PROVIDER_SITE_OTHER): Payer: 59 | Admitting: Internal Medicine

## 2020-09-10 ENCOUNTER — Encounter: Payer: Self-pay | Admitting: Internal Medicine

## 2020-09-10 ENCOUNTER — Other Ambulatory Visit: Payer: Self-pay

## 2020-09-10 VITALS — BP 122/84 | HR 63 | Temp 98.0°F | Resp 16 | Ht 64.0 in | Wt 135.0 lb

## 2020-09-10 DIAGNOSIS — Z0000 Encounter for general adult medical examination without abnormal findings: Secondary | ICD-10-CM

## 2020-09-10 DIAGNOSIS — Z1159 Encounter for screening for other viral diseases: Secondary | ICD-10-CM

## 2020-09-10 LAB — CBC WITH DIFFERENTIAL/PLATELET
Basophils Absolute: 0.1 10*3/uL (ref 0.0–0.1)
Basophils Relative: 1.1 % (ref 0.0–3.0)
Eosinophils Absolute: 0.1 10*3/uL (ref 0.0–0.7)
Eosinophils Relative: 2.4 % (ref 0.0–5.0)
HCT: 41.5 % (ref 36.0–46.0)
Hemoglobin: 13.7 g/dL (ref 12.0–15.0)
Lymphocytes Relative: 22.2 % (ref 12.0–46.0)
Lymphs Abs: 1.3 10*3/uL (ref 0.7–4.0)
MCHC: 33 g/dL (ref 30.0–36.0)
MCV: 87.3 fl (ref 78.0–100.0)
Monocytes Absolute: 0.3 10*3/uL (ref 0.1–1.0)
Monocytes Relative: 5.2 % (ref 3.0–12.0)
Neutro Abs: 4.1 10*3/uL (ref 1.4–7.7)
Neutrophils Relative %: 69.1 % (ref 43.0–77.0)
Platelets: 309 10*3/uL (ref 150.0–400.0)
RBC: 4.75 Mil/uL (ref 3.87–5.11)
RDW: 14.4 % (ref 11.5–15.5)
WBC: 5.9 10*3/uL (ref 4.0–10.5)

## 2020-09-10 LAB — COMPREHENSIVE METABOLIC PANEL
ALT: 21 U/L (ref 0–35)
AST: 23 U/L (ref 0–37)
Albumin: 4.8 g/dL (ref 3.5–5.2)
Alkaline Phosphatase: 40 U/L (ref 39–117)
BUN: 14 mg/dL (ref 6–23)
CO2: 27 mEq/L (ref 19–32)
Calcium: 9.8 mg/dL (ref 8.4–10.5)
Chloride: 101 mEq/L (ref 96–112)
Creatinine, Ser: 0.88 mg/dL (ref 0.40–1.20)
GFR: 78.68 mL/min (ref >60.00)
Glucose, Bld: 73 mg/dL (ref 70–99)
Potassium: 4.6 mEq/L (ref 3.5–5.1)
Sodium: 138 mEq/L (ref 135–145)
Total Bilirubin: 0.8 mg/dL (ref 0.2–1.2)
Total Protein: 7 g/dL (ref 6.0–8.3)

## 2020-09-10 LAB — LIPID PANEL
Cholesterol: 226 mg/dL — ABNORMAL HIGH (ref 0–200)
HDL: 79.4 mg/dL (ref >39.00)
LDL Cholesterol: 133 mg/dL — ABNORMAL HIGH (ref 0–99)
NonHDL: 146.98
Total CHOL/HDL Ratio: 3
Triglycerides: 68 mg/dL (ref 0.0–149.0)
VLDL: 13.6 mg/dL (ref 0.0–40.0)

## 2020-09-10 NOTE — Patient Instructions (Signed)
   GO TO THE LAB : Get the blood work     GO TO THE FRONT DESK, PLEASE SCHEDULE YOUR APPOINTMENTS Come back for a physical exam in 1 year 

## 2020-09-10 NOTE — Assessment & Plan Note (Signed)
Here for CPX Hyperlipidemia: Diet controlled.  Doing well most of the time Heart murmur: Stable on exam, asx, she remains very active. Dysplastic nevus: Sees Derm regularly Had COVID infection May 2022: Was moderately severe, was diagnosed outside of the window for potential treatment, she feels fully recuperated.  Had 3 COVID vaccines. RTC 1 year cpx

## 2020-09-10 NOTE — Assessment & Plan Note (Signed)
--  Td-2013 -S/p Covid shots x3 -Recommend flu shot yearly --Female care per  gyn (Dr Helane Rima), -CCS: 3 options discussed, elected  iFOB --Labs:  CMP, FLP, CBC, hep C, IFOB --Lifestyle discussed, doing well

## 2020-09-10 NOTE — Progress Notes (Signed)
Subjective:    Patient ID: [name], female    DOB: 1975/10/30, 45 y.o.   MRN: [id number]  DOS:  09/10/2020 Type of visit - description: CPX  Since the last office visit she is doing well.  Has no major concerns. Did have [place] back in May, she feels fully recuperated  Review of Systems  Other than above, a 14 point review of systems is negative      Past Medical History:  Diagnosis Date   Allergic rhinitis    has seen ENT and allergy before   Aorta coarctation    s/p correction 1983   Birth control    Husband's vasectomy   Dysplastic nevus 2016   midline inferior upper back   Heart murmur    History of shingles    mild during pregancy 2011   Hyperlipidemia    Palpable abd. 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    Employer: [employer]  Tobacco Use   Smoking status: Never   Smokeless tobacco: Never  Substance and Sexual Activity   Alcohol use: Yes    Comment: Rare   Drug use: No   Sexual activity: Not on file  Other Topics Concern   Not on file  Social History Narrative   P4G2   2 children 2008, 2011   Social Determinants of Health   Financial Resource Strain: Not on file  Food Insecurity: Not on file  Transportation Needs: Not on file  Physical Activity: Not on file  Stress: Not on file  Social Connections: Not on file  Intimate Partner Violence: Not on file     Allergies as of 09/10/2020   No Known Allergies      Medication List         Accurate as of September 10, 2020  6:52 PM. If you have any questions, ask your nurse or doctor.          azelastine 0.1 % nasal spray Commonly known as: ASTELIN Place 2 sprays into both nostrils at bedtime as needed for rhinitis or allergies. Use in each nostril as directed   cholecalciferol 25 MCG (1000 UNIT) tablet Commonly known as: VITAMIN D3 Take 1,000 Units by mouth daily.   ELDERBERRY PO Take by mouth.   FLUoxetine 20 MG tablet Commonly known as: PROZAC Take 1 tablet (20 mg total) by mouth daily.   fluticasone 50 MCG/ACT nasal spray Commonly known as: FLONASE Place 2 sprays into both nostrils daily.           Objective:   Physical Exam BP 122/84 (BP Location: Left Arm, Patient Position: Sitting, Cuff Size: Small)   Pulse 63   Temp 98 F (36.7 C) (Oral)   Resp 16   Ht 5\' 4"  (1.626 m)   Wt 135 lb (61.2 kg)   LMP 09/01/2020 (Exact  Date)   SpO2 97%   BMI 23.17 kg/m  General: Well developed, NAD, BMI noted Neck: No  thyromegaly  HEENT:  Normocephalic . Face symmetric, atraumatic Lungs:  CTA B Normal respiratory effort, no intercostal retractions, no accessory muscle use. Heart: RRR, + systolic murmur   Abdomen:  Not distended, soft, non-tender. No rebound or rigidity.   Lower extremities: no pretibial edema bilaterally  Skin: Exposed areas without rash. Not pale. Not jaundice Neurologic:  alert & oriented X3.  Speech normal, gait appropriate for age and unassisted Strength symmetric and appropriate for age.  Psych: Cognition and judgment appear intact.  Cooperative with normal attention span and concentration.  Behavior appropriate. No anxious or depressed appearing.     Assessment     Assessment Hyperlipidemia CV: -Heart murmur -Aorta coarctation, surgery 1983 -Palpable aorta-- normal renal-aorta US 2009  Dysplastic nevus 2016, +FH melanoma-- sees derm q year Birth-control -- husband's  vasectomy  H/o Shingles 2011 Covid infex  07/2020  PLAN Here for CPX Hyperlipidemia: Diet controlled.  Doing well most of the time Heart murmur: Stable on exam, asx, she remains very active. Dysplastic nevus: Sees Derm regularly Had COVID infection May 2022: Was moderately severe, was diagnosed outside of the window for potential treatment, she feels fully recuperated.  Had 3 COVID vaccines. RTC 1 year cpx  This visit occurred during the SARS-CoV-2 public health emergency.  Safety protocols were in place, including screening questions prior to the visit, additional usage of staff PPE, and extensive cleaning of exam room while observing appropriate contact time as indicated for disinfecting solutions.

## 2020-09-11 LAB — HEPATITIS C ANTIBODY
Hepatitis C Ab: NONREACTIVE
SIGNAL TO CUT-OFF: 0 (ref <1.00)

## 2020-09-18 ENCOUNTER — Other Ambulatory Visit (INDEPENDENT_AMBULATORY_CARE_PROVIDER_SITE_OTHER): Payer: 59

## 2020-09-18 DIAGNOSIS — Z0000 Encounter for general adult medical examination without abnormal findings: Secondary | ICD-10-CM

## 2020-09-18 LAB — FECAL OCCULT BLOOD, IMMUNOCHEMICAL: Fecal Occult Bld: NEGATIVE

## 2021-03-26 LAB — HM MAMMOGRAPHY

## 2021-04-22 ENCOUNTER — Encounter: Payer: Self-pay | Admitting: Internal Medicine

## 2021-08-30 ENCOUNTER — Encounter: Payer: Self-pay | Admitting: Family Medicine

## 2021-08-30 ENCOUNTER — Ambulatory Visit (INDEPENDENT_AMBULATORY_CARE_PROVIDER_SITE_OTHER): Payer: BC Managed Care – PPO | Admitting: Family Medicine

## 2021-08-30 VITALS — BP 140/76 | HR 68 | Ht 64.0 in | Wt 135.8 lb

## 2021-08-30 DIAGNOSIS — F419 Anxiety disorder, unspecified: Secondary | ICD-10-CM

## 2021-08-30 MED ORDER — HYDROXYZINE PAMOATE 25 MG PO CAPS: 25.0000 mg | ORAL_CAPSULE | Freq: Three times a day (TID) | ORAL | 0 refills | Status: DC | PRN

## 2021-08-30 MED ORDER — FLUOXETINE HCL 20 MG PO TABS: 20.0000 mg | ORAL_TABLET | Freq: Every day | ORAL | 3 refills | Status: DC

## 2021-09-16 ENCOUNTER — Ambulatory Visit (INDEPENDENT_AMBULATORY_CARE_PROVIDER_SITE_OTHER): Payer: BC Managed Care – PPO | Admitting: Internal Medicine

## 2021-09-16 ENCOUNTER — Encounter: Payer: Self-pay | Admitting: Internal Medicine

## 2021-09-16 VITALS — BP 124/72 | HR 70 | Temp 98.0°F | Resp 16 | Ht 64.0 in | Wt 135.4 lb

## 2021-09-16 DIAGNOSIS — Z23 Encounter for immunization: Secondary | ICD-10-CM | POA: Diagnosis not present

## 2021-09-16 DIAGNOSIS — Z0000 Encounter for general adult medical examination without abnormal findings: Secondary | ICD-10-CM | POA: Diagnosis not present

## 2021-09-16 LAB — CBC WITH DIFFERENTIAL/PLATELET
Basophils Absolute: 0 10*3/uL (ref 0.0–0.1)
Basophils Relative: 0.6 % (ref 0.0–3.0)
Eosinophils Absolute: 0.1 10*3/uL (ref 0.0–0.7)
Eosinophils Relative: 1.2 % (ref 0.0–5.0)
HCT: 42.6 % (ref 36.0–46.0)
Hemoglobin: 14.1 g/dL (ref 12.0–15.0)
Lymphocytes Relative: 18.8 % (ref 12.0–46.0)
Lymphs Abs: 1.1 10*3/uL (ref 0.7–4.0)
MCHC: 33.1 g/dL (ref 30.0–36.0)
MCV: 86.2 fl (ref 78.0–100.0)
Monocytes Absolute: 0.3 10*3/uL (ref 0.1–1.0)
Monocytes Relative: 5.5 % (ref 3.0–12.0)
Neutro Abs: 4.4 10*3/uL (ref 1.4–7.7)
Neutrophils Relative %: 73.9 % (ref 43.0–77.0)
Platelets: 318 10*3/uL (ref 150.0–400.0)
RBC: 4.94 Mil/uL (ref 3.87–5.11)
RDW: 13.7 % (ref 11.5–15.5)
WBC: 5.9 10*3/uL (ref 4.0–10.5)

## 2021-09-16 LAB — COMPREHENSIVE METABOLIC PANEL
ALT: 17 U/L (ref 0–35)
AST: 18 U/L (ref 0–37)
Albumin: 4.9 g/dL (ref 3.5–5.2)
Alkaline Phosphatase: 39 U/L (ref 39–117)
BUN: 12 mg/dL (ref 6–23)
CO2: 29 mEq/L (ref 19–32)
Calcium: 10.1 mg/dL (ref 8.4–10.5)
Chloride: 97 mEq/L (ref 96–112)
Creatinine, Ser: 0.89 mg/dL (ref 0.40–1.20)
GFR: 77.07 mL/min (ref >60.00)
Glucose, Bld: 98 mg/dL (ref 70–99)
Potassium: 5.1 mEq/L (ref 3.5–5.1)
Sodium: 133 mEq/L — ABNORMAL LOW (ref 135–145)
Total Bilirubin: 0.6 mg/dL (ref 0.2–1.2)
Total Protein: 7.2 g/dL (ref 6.0–8.3)

## 2021-09-16 LAB — LIPID PANEL
Cholesterol: 221 mg/dL — ABNORMAL HIGH (ref 0–200)
HDL: 92 mg/dL (ref >39.00)
LDL Cholesterol: 120 mg/dL — ABNORMAL HIGH (ref 0–99)
NonHDL: 129.27
Total CHOL/HDL Ratio: 2
Triglycerides: 44 mg/dL (ref 0.0–149.0)
VLDL: 8.8 mg/dL (ref 0.0–40.0)

## 2021-09-16 NOTE — Assessment & Plan Note (Signed)
--  Td: Booster today -Covid shots: utd -Recommend flu shot yearly --Female care per  gyn (Dr Helane Rima), -CCS:   iFOB negative last year, will repeat it --Labs:  CMP, FLP, CBC --Lifestyle discussed, doing well, still running

## 2021-09-16 NOTE — Patient Instructions (Signed)
Call in 2 to 3 weeks if you like to increase fluoxetine.   GO TO THE LAB : Get the blood work     [place], [place] back for a checkup in 3 months

## 2021-09-16 NOTE — Progress Notes (Signed)
Subjective:    Patient ID: [name], female    DOB: October 24, 1975, 46 y.o.   MRN: [id number]  DOS:  09/16/2021 Type of visit - description: CPX  Since the last office visit, she was doing very well until few weeks ago when she got anxious and had panic attacks. She has a lot on her plate.  Saw one of my partners, started fluoxetine, is doing better  Review of Systems  Other than above, a 14 point review of systems is negative    Past Medical History:  Diagnosis Date   Allergic rhinitis    has seen ENT and allergy before   Aorta coarctation    s/p correction 1983   Birth control    Husband's vasectomy   Dysplastic nevus 2016   midline inferior upper back   Heart murmur    History of shingles    mild during pregancy 2011   Hyperlipidemia    Palpable abd. 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    Employer: [employer]  Tobacco Use   Smoking status: Never   Smokeless tobacco: Never  Substance and Sexual Activity   Alcohol use: Yes    Comment: Rare   Drug use: No   Sexual activity: Not on file  Other Topics Concern   Not on file  Social History Narrative   P4G2   2 children 2008, 2011   Social Determinants of Health   Financial Resource Strain: Not on file  Food Insecurity: Not on file  Transportation Needs: Not on file  Physical Activity: Not on file  Stress: Not on file  Social Connections: Not on file  Intimate Partner Violence: Not on file    Current  Outpatient Medications  Medication Instructions   azelastine (ASTELIN) 0.1 % nasal spray 2 sprays, Each Nare, At bedtime PRN, Use in each nostril as directed   cholecalciferol (VITAMIN D3) 1,000 Units, Oral, Daily   ELDERBERRY PO Oral   FLUoxetine (PROZAC) 20 mg, Oral, Daily   fluticasone (FLONASE) 50 MCG/ACT nasal spray 2 sprays, Each Nare, Daily   hydrOXYzine (VISTARIL) 25 mg, Oral, Every 8 hours PRN       Objective:   Physical Exam BP 124/72   Pulse 70   Temp 98 F (36.7 C) (Oral)   Resp 16   Ht '5\' 4"'$  (1.626 m)   Wt 135 lb 6 oz (61.4 kg)   LMP 09/07/2021 (Exact Date)   SpO2 97%   BMI 23.24 kg/m  General: Well developed, NAD, BMI noted Neck: No  thyromegaly  HEENT:  Normocephalic . Face symmetric, atraumatic Lungs:  CTA B Normal respiratory effort, no intercostal retractions, no accessory muscle use. Heart: RRR, + systolic murmur Abdomen:  Not distended, soft, non-tender. No rebound or rigidity.  Palpable aorta. Lower extremities: no pretibial edema bilaterally  Skin:  Exposed areas without rash. Not pale. Not jaundice Neurologic:  alert & oriented X3.  Speech normal, gait appropriate for age and unassisted Strength symmetric and appropriate for age.  Psych: Cognition and judgment appear intact.  Cooperative with normal attention span and concentration.  Behavior appropriate. No anxious or depressed appearing.     Assessment     Assessment Hyperlipidemia CV: -Heart murmur -Aorta coarctation, surgery 1983 -Palpable aorta-- normal renal-aorta US 2009  Dysplastic nevus 2016, +FH melanoma-- sees derm q year Birth-control -- husband's  vasectomy  H/o Shingles 2011 Covid infex 07/2020  PLAN Here for CPX Anxiety, panic attacks: See office visit from 08/30/2021, she has a lot on her plate, got anxious, had panic attacks, started fluoxetine, feeling better.  Recommend to continue with fluoxetine, call in few weeks if she feels she needs to increase the dose.   Formal counseling would be helpful.  Reassess in 3 months Heart murmur, history of cortication of the aorta: stable, no sxs. Next visit with cardiology 2024. Nevus: Sees dermatology regularly RTC 3 m

## 2021-09-16 NOTE — Assessment & Plan Note (Signed)
Here for CPX Anxiety, panic attacks: See office visit from 08/30/2021, she has a lot on her plate, got anxious, had panic attacks, started fluoxetine, feeling better.  Recommend to continue with fluoxetine, call in few weeks if she feels she needs to increase the dose.  Formal counseling would be helpful.  Reassess in 3 months Heart murmur, history of cortication of the aorta: stable, no sxs. Next visit with cardiology 2024. Nevus: Sees dermatology regularly RTC 3 m

## 2021-11-03 ENCOUNTER — Encounter: Payer: Self-pay | Admitting: Internal Medicine

## 2021-12-22 ENCOUNTER — Ambulatory Visit (INDEPENDENT_AMBULATORY_CARE_PROVIDER_SITE_OTHER): Payer: BC Managed Care – PPO | Admitting: Internal Medicine

## 2021-12-22 ENCOUNTER — Encounter: Payer: Self-pay | Admitting: Internal Medicine

## 2021-12-22 VITALS — BP 122/74 | HR 63 | Temp 98.3°F | Resp 16 | Ht 64.0 in | Wt 140.2 lb

## 2021-12-22 DIAGNOSIS — F419 Anxiety disorder, unspecified: Secondary | ICD-10-CM

## 2021-12-22 DIAGNOSIS — J309 Allergic rhinitis, unspecified: Secondary | ICD-10-CM

## 2021-12-22 MED ORDER — FLUOXETINE HCL 20 MG PO TABS: 20.0000 mg | ORAL_TABLET | Freq: Every day | ORAL | 1 refills | Status: DC

## 2021-12-22 MED ORDER — FLUTICASONE PROPIONATE 50 MCG/ACT NA SUSP: 2.0000 | Freq: Every day | NASAL | 6 refills | Status: DC

## 2021-12-22 NOTE — Assessment & Plan Note (Signed)
Anxiety and panic attacks: On fluoxetine 20 mg, feeling great.  Very rarely has difficulty with his sleep, states does not  needs any medication for that. We agreed to stay on the same dose and reassess the issue on July 2024 when she comes back for a physical.  RF medicines as needed. Allergies: RF Flonase. Preventive care: Had a flu shot, plans to get a COVID-vaccine, plans to return the Hemoccult card. RTC 09-2021 CPX

## 2021-12-22 NOTE — Progress Notes (Signed)
   Subjective:    Patient ID: [name], female    DOB: [birth year], 46 y.o.   MRN: [id number]  DOS:  12/22/2021 Type of visit - description: Follow-up  Here for follow-up on anxiety, doing great, no further panic attacks. Once or twice a week wakes up at night, but she easily goes  back to sleep. Allergies: Needs refills.   Review of Systems See above   Past Medical History:  Diagnosis Date   Allergic rhinitis    has seen ENT and allergy before   Aorta coarctation    s/p correction 1983   Birth control    Husband's vasectomy   Dysplastic nevus 2016   midline inferior upper back   Heart murmur    History of shingles    mild during pregancy 2011   Hyperlipidemia    Palpable abd. aorta     normal Aorta and renal U/S 12-09    Past Surgical History:  Procedure Laterality Date   BREAST SURGERY  1999   Benign nodule removed   CESAREAN SECTION     x2   [place]   COLON SURGERY  8003   complication from [facility]   [place] OF UTERUS  2006    Current Outpatient Medications  Medication Instructions   azelastine (ASTELIN) 0.1 % nasal spray 2 sprays, Each Nare, At bedtime PRN, Use in each nostril as directed   cholecalciferol (VITAMIN D3) 1,000 Units, Oral, Daily   ELDERBERRY PO Oral   FLUoxetine (PROZAC) 20 mg, Oral, Daily   fluticasone (FLONASE) 50 MCG/ACT nasal spray 2 sprays, Each Nare, Daily   hydrOXYzine (VISTARIL) 25 mg, Oral, Every 8 hours PRN       Objective:   Physical Exam BP 122/74   Pulse 63   Temp 98.3 F (36.8 C) (Oral)   Resp 16   Ht '5\' 4"'$  (1.626 m)   Wt 140 lb 4 oz (63.6 kg)   LMP 12/16/2021 (Exact Date)   SpO2 96%   BMI 24.07 kg/m  General:   Well developed, NAD, BMI noted. HEENT:  Normocephalic . Face symmetric, atraumatic Lower extremities: no pretibial edema bilaterally  Skin: Not pale. Not jaundice Neurologic:  alert & oriented X3.  Speech normal, gait appropriate for age and unassisted Psych--   Cognition and judgment appear intact.  Cooperative with normal attention span and concentration.  Behavior appropriate. No anxious or depressed appearing.      Assessment   Assessment Hyperlipidemia CV: -Heart murmur -Aorta coarctation, surgery 1983 -Palpable aorta-- normal renal-aorta US 2009  Dysplastic nevus 2016, +FH melanoma-- sees derm q year Birth-control -- husband's  vasectomy  H/o Shingles 2011 Covid infex 07/2020  PLAN Anxiety and panic attacks: On fluoxetine 20 mg, feeling great.  Very rarely has difficulty with his sleep, states does not  needs any medication for that. We agreed to stay on the same dose and reassess the issue on July 2024 when she comes back for a physical.  RF medicines as needed. Allergies: RF Flonase. Preventive care: Had a flu shot, plans to get a COVID-vaccine, plans to return the Hemoccult card. RTC 09-2021 CPX

## 2021-12-22 NOTE — Patient Instructions (Signed)
    GO TO THE FRONT DESK, PLEASE SCHEDULE YOUR APPOINTMENTS Come back for  a physical by 09-2022

## 2022-03-29 LAB — HM MAMMOGRAPHY

## 2022-03-29 LAB — HM PAP SMEAR

## 2022-03-29 LAB — RESULTS CONSOLE HPV: CHL HPV: NEGATIVE

## 2022-06-14 ENCOUNTER — Encounter: Payer: Self-pay | Admitting: Internal Medicine

## 2022-06-24 ENCOUNTER — Encounter: Payer: Self-pay | Admitting: Internal Medicine

## 2022-09-19 ENCOUNTER — Ambulatory Visit (INDEPENDENT_AMBULATORY_CARE_PROVIDER_SITE_OTHER): Payer: Managed Care, Other (non HMO) | Admitting: Internal Medicine

## 2022-09-19 ENCOUNTER — Encounter: Payer: Self-pay | Admitting: Internal Medicine

## 2022-09-19 VITALS — BP 122/76 | HR 63 | Temp 97.8°F | Resp 16 | Ht 64.0 in | Wt 145.4 lb

## 2022-09-19 DIAGNOSIS — Z0000 Encounter for general adult medical examination without abnormal findings: Secondary | ICD-10-CM

## 2022-09-19 DIAGNOSIS — E785 Hyperlipidemia, unspecified: Secondary | ICD-10-CM | POA: Diagnosis not present

## 2022-09-19 LAB — COMPREHENSIVE METABOLIC PANEL
ALT: 18 U/L (ref 0–35)
AST: 20 U/L (ref 0–37)
Albumin: 4.8 g/dL (ref 3.5–5.2)
Alkaline Phosphatase: 42 U/L (ref 39–117)
BUN: 16 mg/dL (ref 6–23)
CO2: 26 mEq/L (ref 19–32)
Calcium: 10 mg/dL (ref 8.4–10.5)
Chloride: 101 mEq/L (ref 96–112)
Creatinine, Ser: 0.79 mg/dL (ref 0.40–1.20)
GFR: 88.29 mL/min (ref >60.00)
Glucose, Bld: 82 mg/dL (ref 70–99)
Potassium: 4.4 mEq/L (ref 3.5–5.1)
Sodium: 137 mEq/L (ref 135–145)
Total Bilirubin: 0.7 mg/dL (ref 0.2–1.2)
Total Protein: 7.3 g/dL (ref 6.0–8.3)

## 2022-09-19 LAB — CBC WITH DIFFERENTIAL/PLATELET
Basophils Absolute: 0 10*3/uL (ref 0.0–0.1)
Basophils Relative: 0.6 % (ref 0.0–3.0)
Eosinophils Absolute: 0.1 10*3/uL (ref 0.0–0.7)
Eosinophils Relative: 1.4 % (ref 0.0–5.0)
HCT: 42.4 % (ref 36.0–46.0)
Hemoglobin: 14 g/dL (ref 12.0–15.0)
Lymphocytes Relative: 18.3 % (ref 12.0–46.0)
Lymphs Abs: 1.2 10*3/uL (ref 0.7–4.0)
MCHC: 33.1 g/dL (ref 30.0–36.0)
MCV: 86.7 fl (ref 78.0–100.0)
Monocytes Absolute: 0.3 10*3/uL (ref 0.1–1.0)
Monocytes Relative: 4.4 % (ref 3.0–12.0)
Neutro Abs: 5 10*3/uL (ref 1.4–7.7)
Neutrophils Relative %: 75.3 % (ref 43.0–77.0)
Platelets: 320 10*3/uL (ref 150.0–400.0)
RBC: 4.89 Mil/uL (ref 3.87–5.11)
RDW: 14.5 % (ref 11.5–15.5)
WBC: 6.6 10*3/uL (ref 4.0–10.5)

## 2022-09-19 LAB — LIPID PANEL
Cholesterol: 265 mg/dL — ABNORMAL HIGH (ref 0–200)
HDL: 89.6 mg/dL (ref >39.00)
LDL Cholesterol: 163 mg/dL — ABNORMAL HIGH (ref 0–99)
NonHDL: 175.45
Total CHOL/HDL Ratio: 3
Triglycerides: 62 mg/dL (ref 0.0–149.0)
VLDL: 12.4 mg/dL (ref 0.0–40.0)

## 2022-09-19 MED ORDER — FLUTICASONE PROPIONATE 50 MCG/ACT NA SUSP: 2.0000 | Freq: Every day | NASAL | 3 refills | Status: DC

## 2022-09-19 NOTE — Patient Instructions (Addendum)
Continue the same medications, call for refills when needed.  Vaccines I recommend: Flu shot this fall      GO TO THE LAB : Get the blood work     GO TO THE FRONT DESK, PLEASE SCHEDULE YOUR APPOINTMENTS Come back for a physical exam in 1 year

## 2022-09-19 NOTE — Progress Notes (Unsigned)
Subjective:    Patient ID: [name], female    DOB: April 25, 1975, 47 y.o.   MRN: [id number]  DOS:  09/19/2022 Type of visit - description: CPX  Here for CPX. Feeling well. No major concerns.  Review of Systems See above   Past Medical History:  Diagnosis Date   Allergic rhinitis    has seen ENT and allergy before   Aorta coarctation    s/p correction 1983   Birth control    Husband's vasectomy   Dysplastic nevus 2016   midline inferior upper back   Heart murmur    History of shingles    mild during pregancy 2011   Hyperlipidemia    Palpable abd. aorta     normal Aorta and renal U/S 12-09    Past Surgical History:  Procedure Laterality Date   BREAST SURGERY  1999   Benign nodule removed   CESAREAN SECTION     x2   COARCTATION OF AORTA REPAIR  1983   COLON SURGERY  2007   com[facility] from [facility]   DILATION AND CURETTAGE OF UTERUS  2006    Current Outpatient Medications  Medication Instructions   azelastine (ASTELIN) 0.1 % nasal spray 2 sprays, Each Nare, At bedtime PRN, Use in each nostril as directed   cholecalciferol (VITAMIN D3) 1,000 Units, Oral, Daily   ELDERBERRY PO Oral   FLUoxetine (PROZAC) 20 mg, Oral, Daily   fluticasone (FLONASE) 50 MCG/ACT nasal spray 2 sprays, Each Nare, Daily   hydrOXYzine (VISTARIL) 25 mg, Oral, Every 8 hours PRN       Objective:   Physical Exam BP 122/76   Pulse 63   Temp 97.8 F (36.6 C) (Oral)   Resp 16   Ht 5\' 4"  (1.626 m)   Wt 145 lb 6 oz (65.9 kg)   LMP 08/20/2022 (Exact Date)   SpO2 96%   BMI 24.95 kg/m  General: Well developed, NAD, BMI noted Neck: No  thyromegaly  HEENT:  Normocephalic . Face symmetric, atraumatic Lungs:  CTA B Normal respiratory effort, no intercostal retractions, no accessory muscle use. Heart: RRR, + systolic murmur.  Abdomen:  Not distended, soft, non-tender. No rebound or rigidity.  Palpable nontender aorta Lower extremities: no pretibial edema bilaterally  Skin: Exposed areas  without rash. Not pale. Not jaundice Neurologic:  alert & oriented X3.  Speech normal, gait appropriate for age and unassisted Strength symmetric and appropriate for age.  Psych: Cognition and judgment appear intact.  Cooperative with normal attention span and concentration.  Behavior appropriate. No anxious or depressed appearing.     Assessment     Assessment Hyperlipidemia CV: -Heart murmur -Aorta coarctation, surgery 1983 -Palpable aorta-- normal renal-aorta US 2009  Dysplastic nevus 2016, +FH melanoma-- sees derm q year Birth-control -- husband's  vasectomy  H/o Shingles 2011   PLAN Here for CPX  --Td: 2023 -Covid shots: utd -Recommend flu shot yearly --Female care per  gyn (Dr Vincente Poli), -CCS: 3 options, iFOB Elected  --Labs:  CMP, FLP, CBC --Lifestyle: Still very active, dietary advice provided Cardiovascular: To see cardiology next month Dyslipidemia: Diet control.  Dietary advice provided, check labs Skin cancer: Had a SCC excised recently, sees dermatology regularly. Anxiety and panic attacks: Currently well-controlled on fluoxetine, has not needed hydroxyzine.  RTC 1 year   The 10-year ASCVD risk score (Arnett DK, et al., 2019) is: 0.5%   Values used to calculate the score:     Age: 36 years  Sex: Female     Is Non-Hispanic African American: No     Diabetic: No     Tobacco smoker: No     Systolic Blood Pressure: 122 mmHg     Is BP treated: No     HDL Cholesterol: 92 mg/dL     Total Cholesterol: 221 mg/dL   Anxiety and panic attacks: On fluoxetine 20 mg, feeling great.  Very rarely has difficulty with his sleep, states does not  needs any medication for that. We agreed to stay on the same dose and reassess the issue on July 2024 when she comes back for a physical.  RF medicines as needed. Allergies: RF Flonase. Preventive care: Had a flu shot, plans to get a COVID-vaccine, plans to return the Hemoccult card. RTC 09-2021 CPX

## 2022-09-20 ENCOUNTER — Encounter: Payer: Self-pay | Admitting: Internal Medicine

## 2022-09-20 NOTE — Assessment & Plan Note (Signed)
Here for CPX Cardiovascular: To see cardiology next month Dyslipidemia: Diet control.  Dietary advice provided, check labs Skin cancer: Had a SCC excised recently, sees dermatology regularly. Anxiety and panic attacks: Currently well-controlled on fluoxetine, has not needed hydroxyzine. RTC 1 year

## 2022-09-20 NOTE — Assessment & Plan Note (Signed)
--  Td: 2023 -Covid shots: utd -Recommend flu shot yearly --Female care per  gyn (Dr Vincente Poli), -CCS: 3 options, elected  iFOB   --Labs:  CMP, FLP, CBC --Lifestyle: Still very active, dietary advice provided

## 2022-09-21 ENCOUNTER — Other Ambulatory Visit (INDEPENDENT_AMBULATORY_CARE_PROVIDER_SITE_OTHER): Payer: Managed Care, Other (non HMO)

## 2022-09-21 DIAGNOSIS — Z0000 Encounter for general adult medical examination without abnormal findings: Secondary | ICD-10-CM

## 2022-09-21 LAB — FECAL OCCULT BLOOD, IMMUNOCHEMICAL: Fecal Occult Bld: NEGATIVE

## 2022-09-21 NOTE — Progress Notes (Signed)
 IFOB drop off.

## 2022-09-22 NOTE — Addendum Note (Signed)
Addended byConrad Bourneville D on: 09/22/2022 08:23 AM   Modules accepted: Orders

## 2022-10-23 NOTE — Progress Notes (Unsigned)
Natasha Oddi MD Reason for referral-coarctation  HPI: 47 year old female for evaluation of coarctation at request of [name], MD.  Patient seen previously but not since May 2021. Patient is status post repair of coarctation in 55. Abdominal ultrasound in December of 2009 showed normal abdominal aorta, normal renal arteries. Echocardiogram June 2021 showed normal LV function, normal aortic valve and status post repair of coarctation with highest velocities 2.14 cm/s. CTA May 2021 showed stable postsurgical changes in the proximal descending thoracic aorta with narrowing of the proximal descending segment 1.4 cm minimal diameter.  Cardiology now asked to reassess.  Current Outpatient Medications  Medication Sig Dispense Refill   azelastine (ASTELIN) 0.1 % nasal spray Place 2 sprays into both nostrils at bedtime as needed for rhinitis or allergies. Use in each nostril as directed 30 mL 12   cholecalciferol (VITAMIN D3) 25 MCG (1000 UNIT) tablet Take 1,000 Units by mouth daily.     ELDERBERRY PO Take by mouth.     FLUoxetine (PROZAC) 20 MG tablet Take 1 tablet (20 mg total) by mouth daily. 90 tablet 1   fluticasone (FLONASE) 50 MCG/ACT nasal spray Place 2 sprays into both nostrils daily. 48 mL 3   No current facility-administered medications for this visit.    No Known Allergies   Past Medical History:  Diagnosis Date   Allergic rhinitis    has seen ENT and allergy before   Aorta coarctation    s/p correction 1983   Birth control    Husband's vasectomy   Dysplastic nevus 2016   midline inferior upper back   Heart murmur    History of shingles    mild during pregancy 2011   Hyperlipidemia    Palpable abd. 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    Employer: [employer]  Tobacco Use   Smoking status: Never   Smokeless tobacco: Never  Substance and Sexual Activity   Alcohol use: Yes    Comment: Rare   Drug use: No   Sexual activity: Not on file  Other Topics Concern   Not on file  Social History Narrative   P4G2   2 children 2008, 2011   Social Determinants of Health   Financial Resource Strain: Not on file  Food Insecurity: Not on file  Transportation Needs: Not on file  Physical Activity: Not on file  Stress: Not on file  Social Connections: Not on file  Intimate Partner Violence: Not on file    Family History  Problem Relation Age of Onset   Hypertension Mother    Hyperlipidemia Mother    Hypothyroidism Mother    Melanoma Father    Diabetes Neg Hx    Coronary artery disease Neg Hx    Stroke Neg Hx    Colon cancer Neg Hx  Breast cancer Neg Hx     ROS: no fevers or chills, productive cough, hemoptysis, dysphasia, odynophagia, melena, hematochezia, dysuria, hematuria, rash, seizure activity, orthopnea, PND, pedal edema, claudication. Remaining systems are negative.  Physical Exam:   There were no vitals taken for this visit.  General:  Well developed/well nourished in NAD Skin warm/dry Patient not depressed No peripheral clubbing Back-normal HEENT-normal/normal eyelids Neck supple/normal carotid upstroke bilaterally; no bruits; no JVD; no thyromegaly chest - CTA/ normal expansion CV - RRR/normal S1 and S2; no murmurs, rubs or gallops;  PMI nondisplaced Abdomen -NT/ND, no HSM, no mass, + bowel sounds, no bruit 2+ femoral pulses, no bruits Ext-no edema, chords, 2+ DP Neuro-grossly nonfocal  ECG - personally  reviewed  A/P  1 coarctation of the aorta status postrepair-echocardiogram showed trileaflet aortic valve.  Most recent CTA showed mild residual stenosis.  Will plan repeat study.  2 history of murmur-most recent echocardiogram showed trileaflet aortic valve with no aortic stenosis or aortic insufficiency.  Natasha Millers, MD

## 2022-10-26 ENCOUNTER — Ambulatory Visit: Payer: Managed Care, Other (non HMO) | Attending: Cardiology | Admitting: Cardiology

## 2022-10-26 ENCOUNTER — Encounter: Payer: Self-pay | Admitting: Cardiology

## 2022-10-26 VITALS — BP 159/89 | HR 73 | Ht 64.0 in | Wt 145.8 lb

## 2022-10-26 DIAGNOSIS — Q251 Coarctation of aorta: Secondary | ICD-10-CM

## 2022-10-26 DIAGNOSIS — R011 Cardiac murmur, unspecified: Secondary | ICD-10-CM | POA: Diagnosis not present

## 2022-10-26 DIAGNOSIS — R9431 Abnormal electrocardiogram [ECG] [EKG]: Secondary | ICD-10-CM | POA: Diagnosis not present

## 2022-10-26 NOTE — Patient Instructions (Signed)
    Testing/Procedures:  CTA OF THE CHEST AT [place] 1SR FLOOR IMAGING DEPARTMENT   Follow-Up: At [facility], you and your health needs are our priority.  As part of our continuing mission to provide you with exceptional heart care, we have created designated Provider Care Teams.  These Care Teams include your primary Cardiologist (physician) and Advanced Practice Providers (APPs -  Physician Assistants and Nurse Practitioners) who all work together to provide you with the care you need, when you need it.  We recommend signing up for the patient portal called "MyChart".  Sign up information is provided on this After Visit Summary.  MyChart is used to connect with patients for Virtual Visits (Telemedicine).  Patients are able to view lab/test results, encounter notes, upcoming appointments, etc.  Non-urgent messages can be sent to your provider as well.   To learn more about what you can do with MyChart, go to [email or web address].    Your next appointment:   3 year(s)  Provider:   [name], MD

## 2022-11-03 ENCOUNTER — Ambulatory Visit (HOSPITAL_BASED_OUTPATIENT_CLINIC_OR_DEPARTMENT_OTHER): Admission: RE | Admit: 2022-11-03 | Payer: Managed Care, Other (non HMO) | Source: Ambulatory Visit

## 2022-11-10 ENCOUNTER — Ambulatory Visit (HOSPITAL_BASED_OUTPATIENT_CLINIC_OR_DEPARTMENT_OTHER): Payer: Managed Care, Other (non HMO)

## 2022-11-10 ENCOUNTER — Ambulatory Visit (INDEPENDENT_AMBULATORY_CARE_PROVIDER_SITE_OTHER): Payer: Managed Care, Other (non HMO)

## 2022-11-10 DIAGNOSIS — Q251 Coarctation of aorta: Secondary | ICD-10-CM | POA: Diagnosis not present

## 2022-11-10 MED ORDER — IOHEXOL 350 MG/ML SOLN
100.0000 mL | Freq: Once | INTRAVENOUS | Status: AC | PRN
  Administered 2022-11-10: 100 mL via INTRAVENOUS

## 2023-02-11 ENCOUNTER — Other Ambulatory Visit: Payer: Self-pay | Admitting: Internal Medicine

## 2023-02-11 DIAGNOSIS — F419 Anxiety disorder, unspecified: Secondary | ICD-10-CM

## 2023-03-31 LAB — HM MAMMOGRAPHY

## 2023-04-24 ENCOUNTER — Telehealth: Payer: Managed Care, Other (non HMO) | Admitting: Family Medicine

## 2023-04-24 DIAGNOSIS — R051 Acute cough: Secondary | ICD-10-CM | POA: Diagnosis not present

## 2023-04-24 MED ORDER — BENZONATATE 200 MG PO CAPS: 200.0000 mg | ORAL_CAPSULE | Freq: Two times a day (BID) | ORAL | 0 refills | Status: AC | PRN

## 2023-04-24 MED ORDER — PREDNISONE 20 MG PO TABS: 20.0000 mg | ORAL_TABLET | Freq: Two times a day (BID) | ORAL | 0 refills | Status: AC

## 2023-04-24 MED ORDER — PROMETHAZINE-DM 6.25-15 MG/5ML PO SYRP: 5.0000 mL | ORAL_SOLUTION | Freq: Four times a day (QID) | ORAL | 0 refills | Status: AC | PRN

## 2023-04-24 NOTE — Progress Notes (Signed)
 Virtual Visit Consent   [name], you are scheduled for a virtual visit with a  provider today. Just as with appointments in the office, your consent must be obtained to participate. Your consent will be active for this visit and any virtual visit you may have with one of our providers in the next 365 days. If you have a MyChart account, a copy of this consent can be sent to you electronically.  As this is a virtual visit, video technology does not allow for your provider to perform a traditional examination. This may limit your provider's ability to fully assess your condition. If your provider identifies any concerns that need to be evaluated in person or the need to arrange testing (such as labs, EKG, etc.), we will make arrangements to do so. Although advances in technology are sophisticated, we cannot ensure that it will always work on either your end or our end. If the connection with a video visit is poor, the visit may have to be switched to a telephone visit. With either a video or telephone visit, we are not always able to ensure that we have a secure connection.  By engaging in this virtual visit, you consent to the provision of healthcare and authorize for your insurance to be billed (if applicable) for the services provided during this visit. Depending on your insurance coverage, you may receive a charge related to this service.  I need to obtain your verbal consent now. Are you willing to proceed with your visit today? [name] has provided verbal consent on 04/24/2023 for a virtual visit (video or telephone). Georgana Curio, FNP  Date: 04/24/2023 6:25 PM   Virtual Visit via Video Note   I, Georgana Curio, connected with  [name]  ([id number], 1975-04-05) on 04/24/23 at  6:30 PM EST by a video-enabled telemedicine application and verified that I am speaking with the correct person using two identifiers.  Location: Patient: Virtual Visit Location Patient: Home Provider:  Virtual Visit Location Provider: Home Office   I discussed the limitations of evaluation and management by telemedicine and the availability of in person appointments. The patient expressed understanding and agreed to proceed.    History of Present Illness: [name] is a 48 y.o. who identifies as a female who was assigned female at birth, and is being seen today for persistent cough following URI a couple of weeks ago. Cough is dry and worsening. No wheezing or sob. No fever. Marland Kitchen  HPI: HPI  Problems:  Patient Active Problem List   Diagnosis Date Noted   Perimenopausal 07/17/2017   PCP NOTES >>>>>>>>>>>>>>>>>>.Marland Kitchen 07/10/2015   Annual physical exam 05/23/2011   Allergic rhinitis 02/05/2008   COARCTATION OF AORTA 02/05/2008   SYSTOLIC MURMUR 02/05/2008   ELEVATED BLOOD PRESSURE WITHOUT DIAGNOSIS OF HYPERTENSION 02/05/2008    Allergies: No Known Allergies Medications:  Current Outpatient Medications:    azelastine (ASTELIN) 0.1 % nasal spray, Place 2 sprays into both nostrils at bedtime as needed for rhinitis or allergies. Use in each nostril as directed, Disp: 30 mL, Rfl: 12   cholecalciferol (VITAMIN D3) 25 MCG (1000 UNIT) tablet, Take 1,000 Units by mouth daily., Disp: , Rfl:    ELDERBERRY PO, Take by mouth., Disp: , Rfl:    FLUoxetine (PROZAC) 20 MG capsule, Take 1 capsule (20 mg total) by mouth daily., Disp: 90 capsule, Rfl: 1   fluticasone (FLONASE) 50 MCG/ACT nasal spray, Place 2 sprays into both nostrils daily., Disp: 48  mL, Rfl: 3  Observations/Objective: Patient is well-developed, well-nourished in no acute distress.  Resting comfortably  at home.  Head is normocephalic, atraumatic.  No labored breathing.  Speech is clear and coherent with logical content.  Patient is alert and oriented at baseline.    Assessment and Plan: 1. Acute cough (Primary)  Increase fluids, humidifier at night, tylenol or ibuprofen as directed, UC as needed.   Follow Up Instructions: I  discussed the assessment and treatment plan with the patient. The patient was provided an opportunity to ask questions and all were answered. The patient agreed with the plan and demonstrated an understanding of the instructions.  A copy of instructions were sent to the patient via MyChart unless otherwise noted below.     The patient was advised to call back or seek an in-person evaluation if the symptoms worsen or if the condition fails to improve as anticipated.    Georgana Curio, FNP

## 2023-04-24 NOTE — Patient Instructions (Signed)

## 2023-04-27 ENCOUNTER — Telehealth: Payer: Managed Care, Other (non HMO) | Admitting: Physician Assistant

## 2023-04-27 DIAGNOSIS — J208 Acute bronchitis due to other specified organisms: Secondary | ICD-10-CM

## 2023-04-27 DIAGNOSIS — B9689 Other specified bacterial agents as the cause of diseases classified elsewhere: Secondary | ICD-10-CM

## 2023-04-27 MED ORDER — AZITHROMYCIN 250 MG PO TABS: ORAL_TABLET | ORAL | 0 refills | Status: AC

## 2023-04-27 NOTE — Progress Notes (Signed)
 Virtual Visit Consent   [name], you are scheduled for a virtual visit with a [place] provider today. Just as with appointments in the office, your consent must be obtained to participate. Your consent will be active for this visit and any virtual visit you may have with one of our providers in the next 365 days. If you have a MyChart account, a copy of this consent can be sent to you electronically.  As this is a virtual visit, video technology does not allow for your provider to perform a traditional examination. This may limit your provider's ability to fully assess your condition. If your provider identifies any concerns that need to be evaluated in person or the need to arrange testing (such as labs, EKG, etc.), we will make arrangements to do so. Although advances in technology are sophisticated, we cannot ensure that it will always work on either your end or our end. If the connection with a video visit is poor, the visit may have to be switched to a telephone visit. With either a video or telephone visit, we are not always able to ensure that we have a secure connection.  By engaging in this virtual visit, you consent to the provision of healthcare and authorize for your insurance to be billed (if applicable) for the services provided during this visit. Depending on your insurance coverage, you may receive a charge related to this service.  I need to obtain your verbal consent now. Are you willing to proceed with your visit today? [name] has provided verbal consent on 04/27/2023 for a virtual visit (video or telephone). [name], PA-C  Date: 04/27/2023 8:15 AM   Virtual Visit via Video Note   IMargaretann Loveless, connected with  [name]  ([id number], 04-23-1975) on 04/27/23 at  8:15 AM EST by a video-enabled telemedicine application and verified that I am speaking with the correct person using two identifiers.  Location: Patient: Virtual Visit Location Patient:  Home Provider: Virtual Visit Location Provider: Home Office   I discussed the limitations of evaluation and management by telemedicine and the availability of in person appointments. The patient expressed understanding and agreed to proceed.    History of Present Illness: [name] is a 48 y.o. who identifies as a female who was assigned female at birth, and is being seen today for continued cough.  HPI: URI  This is a new problem. The current episode started 1 to 4 weeks ago (Seen MOnday, 04/24/23, virtually and given Prednisone, Benzonatate, and Promethazine DM; Yesterday her cough started to change; ongoign for 3 weeks). The problem has been gradually worsening. There has been no fever. Associated symptoms include congestion, coughing and headaches. Pertinent negatives include no ear pain, plugged ear sensation, rhinorrhea, sinus pain, sore throat or wheezing. Treatments tried: Prednisone, Tessalon, Promethazine DM, Mucinex, Dayquil/Nyquil preeviously.     Problems:  Patient Active Problem List   Diagnosis Date Noted   Perimenopausal 07/17/2017   PCP NOTES >>>>>>>>>>>>>>>>>>.Marland Kitchen 07/10/2015   Annual physical exam 05/23/2011   Allergic rhinitis 02/05/2008   COARCTATION OF AORTA 02/05/2008   SYSTOLIC MURMUR 02/05/2008   ELEVATED BLOOD PRESSURE WITHOUT DIAGNOSIS OF HYPERTENSION 02/05/2008    Allergies: No Known Allergies Medications:  Current Outpatient Medications:    azithromycin (ZITHROMAX) 250 MG tablet, Take 2 tablets on day 1, then 1 tablet daily on days 2 through 5, Disp: 6 tablet, Rfl: 0   azelastine (ASTELIN) 0.1 % nasal spray, Place 2  sprays into both nostrils at bedtime as needed for rhinitis or allergies. Use in each nostril as directed, Disp: 30 mL, Rfl: 12   benzonatate (TESSALON) 200 MG capsule, Take 1 capsule (200 mg total) by mouth 2 (two) times daily as needed for up to 10 days for cough., Disp: 20 capsule, Rfl: 0   cholecalciferol (VITAMIN D3) 25 MCG (1000 UNIT) tablet,  Take 1,000 Units by mouth daily., Disp: , Rfl:    ELDERBERRY PO, Take by mouth., Disp: , Rfl:    FLUoxetine (PROZAC) 20 MG capsule, Take 1 capsule (20 mg total) by mouth daily., Disp: 90 capsule, Rfl: 1   fluticasone (FLONASE) 50 MCG/ACT nasal spray, Place 2 sprays into both nostrils daily., Disp: 48 mL, Rfl: 3   predniSONE (DELTASONE) 20 MG tablet, Take 1 tablet (20 mg total) by mouth 2 (two) times daily with a meal for 5 days., Disp: 10 tablet, Rfl: 0   promethazine-dextromethorphan (PROMETHAZINE-DM) 6.25-15 MG/5ML syrup, Take 5 mLs by mouth 4 (four) times daily as needed for up to 10 days for cough., Disp: 118 mL, Rfl: 0  Observations/Objective: Patient is well-developed, well-nourished in no acute distress.  Resting comfortably at home.  Head is normocephalic, atraumatic.  No labored breathing.  Speech is clear and coherent with logical content.  Patient is alert and oriented at baseline.    Assessment and Plan: 1. Acute bacterial bronchitis (Primary) - azithromycin (ZITHROMAX) 250 MG tablet; Take 2 tablets on day 1, then 1 tablet daily on days 2 through 5  Dispense: 6 tablet; Refill: 0  - Worsening over a week despite OTC medications - Will treat with Z-pack  - Can continue Prednisone until completed, Tessalon, and Promethazine DM as needed - Push fluids.  - Rest.  - Steam and humidifier can help - Seek in person evaluation if worsening or symptoms fail to improve    Follow Up Instructions: I discussed the assessment and treatment plan with the patient. The patient was provided an opportunity to ask questions and all were answered. The patient agreed with the plan and demonstrated an understanding of the instructions.  A copy of instructions were sent to the patient via MyChart unless otherwise noted below.    The patient was advised to call back or seek an in-person evaluation if the symptoms worsen or if the condition fails to improve as anticipated.    [name],  PA-C

## 2023-04-27 NOTE — Patient Instructions (Signed)
 [name], thank you for joining Natasha Loveless, PA-C for today's virtual visit.  While this provider is not your primary care provider (PCP), if your PCP is located in our provider database this encounter information will be shared with them immediately following your visit.   A [facility] MyChart account gives you access to today's visit and all your visits, tests, and labs performed at [facility] " click here if you don't have a [facility] MyChart account or go to [email or web address]  Consent: (Patient) [name] provided verbal consent for this virtual visit at the beginning of the encounter.  Current Medications:  Current Outpatient Medications:    azithromycin (ZITHROMAX) 250 MG tablet, Take 2 tablets on day 1, then 1 tablet daily on days 2 through 5, Disp: 6 tablet, Rfl: 0   azelastine (ASTELIN) 0.1 % nasal spray, Place 2 sprays into both nostrils at bedtime as needed for rhinitis or allergies. Use in each nostril as directed, Disp: 30 mL, Rfl: 12   benzonatate (TESSALON) 200 MG capsule, Take 1 capsule (200 mg total) by mouth 2 (two) times daily as needed for up to 10 days for cough., Disp: 20 capsule, Rfl: 0   cholecalciferol (VITAMIN D3) 25 MCG (1000 UNIT) tablet, Take 1,000 Units by mouth daily., Disp: , Rfl:    ELDERBERRY PO, Take by mouth., Disp: , Rfl:    FLUoxetine (PROZAC) 20 MG capsule, Take 1 capsule (20 mg total) by mouth daily., Disp: 90 capsule, Rfl: 1   fluticasone (FLONASE) 50 MCG/ACT nasal spray, Place 2 sprays into both nostrils daily., Disp: 48 mL, Rfl: 3   predniSONE (DELTASONE) 20 MG tablet, Take 1 tablet (20 mg total) by mouth 2 (two) times daily with a meal for 5 days., Disp: 10 tablet, Rfl: 0   promethazine-dextromethorphan (PROMETHAZINE-DM) 6.25-15 MG/5ML syrup, Take 5 mLs by mouth 4 (four) times daily as needed for up to 10 days for cough., Disp: 118 mL, Rfl: 0   Medications ordered in this encounter:  Meds ordered this encounter   Medications   azithromycin (ZITHROMAX) 250 MG tablet    Sig: Take 2 tablets on day 1, then 1 tablet daily on days 2 through 5    Dispense:  6 tablet    Refill:  0    Supervising Provider:   Merrilee Jansky [phone]     *If you need refills on other medications prior to your next appointment, please contact your pharmacy*  Follow-Up: Call back or seek an in-[name] evaluation if the symptoms worsen or if the condition fails to improve as anticipated.  [facility] [phone]  Other Instructions  Acute Bronchitis, Adult  Acute bronchitis is sudden inflammation of the main airways (bronchi) that come off the windpipe (trachea) in the lungs. The swelling causes the airways to get smaller and make more mucus than normal. This can make it hard to breathe and can cause coughing or noisy breathing (wheezing). Acute bronchitis may last several weeks. The cough may last longer. Allergies, asthma, and exposure to smoke may make the condition worse. What are the causes? This condition can be caused by germs and by substances that irritate the lungs, including: Cold and flu viruses. The most common cause of this condition is the virus that causes the common cold. Bacteria. This is less common. Breathing in substances that irritate the lungs, including: Smoke from cigarettes and other forms of tobacco. Dust and pollen. Fumes from household cleaning products, gases, or  burned fuel. Indoor or outdoor air pollution. What increases the risk? The following factors may make you more likely to develop this condition: A weak body's defense system, also called the immune system. A condition that affects your lungs and breathing, such as asthma. What are the signs or symptoms? Common symptoms of this condition include: Coughing. This may bring up clear, yellow, or green mucus from your lungs (sputum). Wheezing. Runny or stuffy nose. Having too much mucus in your lungs (chest  congestion). Shortness of breath. Aches and pains, including sore throat or chest. How is this diagnosed? This condition is usually diagnosed based on: Your symptoms and medical history. A physical exam. You may also have other tests, including tests to rule out other conditions, such as pneumonia. These tests include: A test of lung function. Test of a mucus sample to look for the presence of bacteria. Tests to check the oxygen level in your blood. Blood tests. Chest X-ray. How is this treated? Most cases of acute bronchitis clear up over time without treatment. Your health care provider may recommend: Drinking more fluids to help thin your mucus so it is easier to cough up. Taking inhaled medicine (inhaler) to improve air flow in and out of your lungs. Using a vaporizer or a humidifier. These are machines that add water to the air to help you breathe better. Taking a medicine that thins mucus and clears congestion (expectorant). Taking a medicine that prevents or stops coughing (cough suppressant). It is not common to take an antibiotic medicine for this condition. Follow these instructions at home:  Take over-the-counter and prescription medicines only as told by your health care provider. Use an inhaler, vaporizer, or humidifier as told by your health care provider. Take two teaspoons (10 mL) of honey at bedtime to lessen coughing at night. Drink enough fluid to keep your urine pale yellow. Do not use any products that contain nicotine or tobacco. These products include cigarettes, chewing tobacco, and vaping devices, such as e-cigarettes. If you need help quitting, ask your health care provider. Get plenty of rest. Return to your normal activities as told by your health care provider. Ask your health care provider what activities are safe for you. Keep all follow-up visits. This is important. How is this prevented? To lower your risk of getting this condition again: Wash your  hands often with soap and water for at least 20 seconds. If soap and water are not available, use hand sanitizer. Avoid contact with people who have cold symptoms. Try not to touch your mouth, nose, or eyes with your hands. Avoid breathing in smoke or chemical fumes. Breathing smoke or chemical fumes will make your condition worse. Get the flu shot every year. Contact a health care provider if: Your symptoms do not improve after 2 weeks. You have trouble coughing up the mucus. Your cough keeps you awake at night. You have a fever. Get help right away if you: Cough up blood. Feel pain in your chest. Have severe shortness of breath. Faint or keep feeling like you are going to faint. Have a severe headache. Have a fever or chills that get worse. These symptoms may represent a serious problem that is an emergency. Do not wait to see if the symptoms will go away. Get medical help right away. Call your local emergency services (911 in the [place] Do not drive yourself to the hospital. Summary Acute bronchitis is inflammation of the main airways (bronchi) that come off the windpipe (trachea) in  the lungs. The swelling causes the airways to get smaller and make more mucus than normal. Drinking more fluids can help thin your mucus so it is easier to cough up. Take over-the-counter and prescription medicines only as told by your health care provider. Do not use any products that contain nicotine or tobacco. These products include cigarettes, chewing tobacco, and vaping devices, such as e-cigarettes. If you need help quitting, ask your health care provider. Contact a health care provider if your symptoms do not improve after 2 weeks. This information is not intended to replace advice given to you by your health care provider. Make sure you discuss any questions you have with your health care provider. Document Revised: 06/03/2021 Document Reviewed: 06/24/2020 Elsevier Patient Education  2024 Elsevier  Inc.   If you have been instructed to have an in-[name] evaluation today at a local Urgent Care facility, please use the link below. It will take you to a list of all of our available [facility], including address, phone number and hours of operation. Please do not delay care.  [facility]  If you or a family member do not have a primary care provider, use the link below to schedule a visit and establish care. When you choose a [facility] primary care physician or advanced practice provider, you gain a long-term partner in health. Find a Primary Care Provider  Learn more about [facility]'s in-office and virtual care options:  - Get Care Now

## 2023-05-09 ENCOUNTER — Encounter: Payer: Self-pay | Admitting: Family Medicine

## 2023-05-09 ENCOUNTER — Ambulatory Visit (INDEPENDENT_AMBULATORY_CARE_PROVIDER_SITE_OTHER): Admitting: Family Medicine

## 2023-05-09 VITALS — BP 163/92 | HR 86 | Temp 98.1°F | Ht 64.0 in | Wt 143.0 lb

## 2023-05-09 DIAGNOSIS — R051 Acute cough: Secondary | ICD-10-CM

## 2023-05-09 LAB — POCT INFLUENZA A/B
Influenza A, POC: NEGATIVE
Influenza B, POC: NEGATIVE

## 2023-05-09 MED ORDER — AMOXICILLIN-POT CLAVULANATE 875-125 MG PO TABS: 1.0000 | ORAL_TABLET | Freq: Two times a day (BID) | ORAL | 0 refills | Status: AC

## 2023-05-09 MED ORDER — PREDNISONE 20 MG PO TABS: 20.0000 mg | ORAL_TABLET | Freq: Every day | ORAL | 0 refills | Status: AC

## 2023-05-09 NOTE — Progress Notes (Signed)
 Acute Office Visit  Subjective:     Patient ID: [name], female    DOB: [birth year], 48 y.o.   MRN: [id number]  Chief Complaint  Patient presents with   Cough     Patient is in today for cough.    Discussed the use of AI scribe software for clinical note transcription with the patient, who gave verbal consent to proceed.  History of Present Illness [name] is a 48 year old female who presents with persistent cough and flu-like symptoms. She mentions that her son, who is 43 years old, tested positive for the flu yesterday.  She has been experiencing a persistent cough for the past two weeks. Initially, the cough was dry and inflamed but has become more productive over time, with colored sputum. She has been using Mucinex inconsistently and finds some relief with leftover promethazine DM cough syrup. Her symptoms began in the sinuses and progressed to her chest.  Recently, she developed a stuffy nose and throat drainage, which she associates with her son's recent positive flu test. She experienced feverish symptoms with chills and sweats last night, with a recorded temperature of 99.84F. She feels cold and feverish, with a slight headache but no ear pain, nausea, or vomiting. No significant shortness of breath or wheezing, though she feels 'something's in there' when breathing deeply.  She initially had a telehealth visit and was prescribed a Z-Pak on February 20th, but she feels it did not fully resolve her symptoms. She recalls taking prednisone at the onset of her symptoms when the cough was non-productive.    All review of systems negative except what is listed in the HPI      Objective:    BP (!) 163/92   Pulse 86   Temp 98.1 F (36.7 C) (Oral)   Ht 5\' 4"  (1.626 m)   Wt 143 lb (64.9 kg)   SpO2 99%   BMI 24.55 kg/m    Physical Exam Vitals reviewed.  Constitutional:      Appearance: Normal appearance.  HENT:     Head: Normocephalic and atraumatic.      Right Ear: Tympanic membrane normal.     Left Ear: Tympanic membrane normal.     Nose: Congestion and rhinorrhea present.  Cardiovascular:     Rate and Rhythm: Normal rate and regular rhythm.  Pulmonary:     Effort: Pulmonary effort is normal.     Breath sounds: Normal breath sounds.     Comments: Mildly diminished  Skin:    General: Skin is warm and dry.  Neurological:     Mental Status: She is alert and oriented to person, place, and time.  Psychiatric:        Mood and Affect: Mood normal.        Behavior: Behavior normal.        Thought Content: Thought content normal.        Judgment: Judgment normal.       Results for orders placed or performed in visit on 05/09/23  POCT Influenza A/B  Result Value Ref Range   Influenza A, POC Negative Negative   Influenza B, POC Negative Negative        Assessment & Plan:   Problem List Items Addressed This Visit   None Visit Diagnoses       Acute cough    -  Primary   Relevant Medications   amoxicillin-clavulanate (AUGMENTIN) 875-125 MG tablet   predniSONE (DELTASONE) 20 MG tablet  Other Relevant Orders   POCT Influenza A/B (Completed)      Assessment & Plan Upper Respiratory Infection Persistent cough and congestion for two weeks, initially improved with Z-Pak but symptoms returned. Recent exposure to son with confirmed influenza. Mildly diminished breath sounds on right side. -Continue Promethazine DM as needed for cough. -Flu negative. Recommend home test tomorrow.  -Adding Augmentin for ongoing respiratory symptoms >2 weeks -Standby Prednisone available if dyspnea worsens       Meds ordered this encounter  Medications   amoxicillin-clavulanate (AUGMENTIN) 875-125 MG tablet    Sig: Take 1 tablet by mouth 2 (two) times daily for 7 days.    Dispense:  14 tablet    Refill:  0    Supervising Provider:   Danise Edge A [[id number]]   predniSONE (DELTASONE) 20 MG tablet    Sig: Take 1 tablet (20 mg total) by mouth  daily with breakfast for 5 days.    Dispense:  5 tablet    Refill:  0    Supervising Provider:   Danise Edge A [[id number]]    Return if symptoms worsen or fail to improve.  Clayborne Dana, NP

## 2023-06-29 ENCOUNTER — Encounter: Payer: Self-pay | Admitting: Internal Medicine

## 2023-06-30 ENCOUNTER — Encounter: Payer: Self-pay | Admitting: Internal Medicine

## 2023-08-12 ENCOUNTER — Other Ambulatory Visit: Payer: Self-pay | Admitting: Internal Medicine

## 2023-08-12 DIAGNOSIS — F419 Anxiety disorder, unspecified: Secondary | ICD-10-CM

## 2023-09-22 ENCOUNTER — Encounter: Payer: Self-pay | Admitting: Internal Medicine

## 2023-09-22 ENCOUNTER — Ambulatory Visit (INDEPENDENT_AMBULATORY_CARE_PROVIDER_SITE_OTHER): Payer: Managed Care, Other (non HMO) | Admitting: Internal Medicine

## 2023-09-22 VITALS — BP 122/84 | HR 67 | Temp 98.0°F | Resp 16 | Ht 64.0 in | Wt 142.2 lb

## 2023-09-22 DIAGNOSIS — E785 Hyperlipidemia, unspecified: Secondary | ICD-10-CM

## 2023-09-22 DIAGNOSIS — Z0000 Encounter for general adult medical examination without abnormal findings: Secondary | ICD-10-CM | POA: Diagnosis not present

## 2023-09-22 LAB — LIPID PANEL
Cholesterol: 233 mg/dL — ABNORMAL HIGH (ref 0–200)
HDL: 82.9 mg/dL (ref >39.00)
LDL Cholesterol: 140 mg/dL — ABNORMAL HIGH (ref 0–99)
NonHDL: 149.73
Total CHOL/HDL Ratio: 3
Triglycerides: 51 mg/dL (ref 0.0–149.0)
VLDL: 10.2 mg/dL (ref 0.0–40.0)

## 2023-09-22 LAB — COMPREHENSIVE METABOLIC PANEL WITH GFR
ALT: 16 U/L (ref 0–35)
AST: 19 U/L (ref 0–37)
Albumin: 4.9 g/dL (ref 3.5–5.2)
Alkaline Phosphatase: 45 U/L (ref 39–117)
BUN: 12 mg/dL (ref 6–23)
CO2: 28 meq/L (ref 19–32)
Calcium: 9.5 mg/dL (ref 8.4–10.5)
Chloride: 101 meq/L (ref 96–112)
Creatinine, Ser: 0.85 mg/dL (ref 0.40–1.20)
GFR: 80.3 mL/min (ref >60.00)
Glucose, Bld: 86 mg/dL (ref 70–99)
Potassium: 4.5 meq/L (ref 3.5–5.1)
Sodium: 138 meq/L (ref 135–145)
Total Bilirubin: 0.6 mg/dL (ref 0.2–1.2)
Total Protein: 7.1 g/dL (ref 6.0–8.3)

## 2023-09-22 LAB — CBC WITH DIFFERENTIAL/PLATELET
Basophils Absolute: 0 K/uL (ref 0.0–0.1)
Basophils Relative: 0.7 % (ref 0.0–3.0)
Eosinophils Absolute: 0.1 K/uL (ref 0.0–0.7)
Eosinophils Relative: 2.4 % (ref 0.0–5.0)
HCT: 41.6 % (ref 36.0–46.0)
Hemoglobin: 13.8 g/dL (ref 12.0–15.0)
Lymphocytes Relative: 30.9 % (ref 12.0–46.0)
Lymphs Abs: 1.3 K/uL (ref 0.7–4.0)
MCHC: 33.1 g/dL (ref 30.0–36.0)
MCV: 86.1 fl (ref 78.0–100.0)
Monocytes Absolute: 0.2 K/uL (ref 0.1–1.0)
Monocytes Relative: 5.6 % (ref 3.0–12.0)
Neutro Abs: 2.6 K/uL (ref 1.4–7.7)
Neutrophils Relative %: 60.4 % (ref 43.0–77.0)
Platelets: 326 K/uL (ref 150.0–400.0)
RBC: 4.83 Mil/uL (ref 3.87–5.11)
RDW: 13.2 % (ref 11.5–15.5)
WBC: 4.3 K/uL (ref 4.0–10.5)

## 2023-09-22 LAB — TSH: TSH: 2.38 u[IU]/mL (ref 0.35–5.50)

## 2023-09-22 NOTE — Progress Notes (Signed)
 Subjective:    Patient ID: [name], female    DOB: 09-11-1975, 48 y.o.   MRN: [id number]  DOS:  09/22/2023 Type of visit - description: CPX  Feeling well, has no concerns.   Review of Systems  A 14 point review of systems is negative    Past Medical History:  Diagnosis Date   Allergic rhinitis    has seen ENT and allergy before   Aorta coarctation    s/p correction 1983   Birth control    Husband's vasectomy   Dysplastic nevus 2016   midline inferior upper back   Heart murmur    History of shingles    mild during pregancy 2011   Hyperlipidemia    Palpable abd. aorta     normal Aorta and renal U/S 12-09    Past Surgical History:  Procedure Laterality Date   BREAST SURGERY  1999   Benign nodule removed   CESAREAN SECTION     x2   COARCTATION OF AORTA REPAIR  1983   COLON SURGERY  2007   complication from [facility]   DILATION AND CURETTAGE OF UTERUS  2006   Social History   Socioeconomic History   Marital status: Married    Spouse name: Not on file   Number of children: 2   Years of education: Not on file   Highest education level: Bachelor's degree (e.g., BA, AB, BS)  Occupational History   Occupation: Art therapist: [employer]  Tobacco Use   Smoking status: Never   Smokeless tobacco: Never  Substance and Sexual Activity   Alcohol use: Yes    Comment: Rare   Drug use: No   Sexual activity: Not on file  Other Topics Concern   Not on file  Social History Narrative   P4G2   2 children 2008, 2011   Social Drivers of Corporate investment banker Strain: Low Risk  (05/08/2023)   Overall Financial Resource Strain (CARDIA)    Difficulty of Paying Living Expenses: Not hard at all  Food Insecurity: No Food Insecurity (05/08/2023)   Hunger Vital Sign    Worried About Running Out of Food in the Last Year: Never true    Ran Out of Food in the Last Year: Never true  Transportation Needs: No Transportation Needs (05/08/2023)   PRAPARE -  Administrator, Civil Service (Medical): No    Lack of Transportation (Non-Medical): No  Physical Activity: Sufficiently Active (05/08/2023)   Exercise Vital Sign    Days of Exercise per Week: 7 days    Minutes of Exercise per Session: 30 min  Stress: No Stress Concern Present (05/08/2023)   [employer] of Occupational Health - Occupational Stress Questionnaire    Feeling of Stress : Not at all  Social Connections: Socially Integrated (05/08/2023)   Social Connection and Isolation Panel    Frequency of Communication with Friends and Family: More than three times a week    Frequency of Social Gatherings with Friends and Family: Once a week    Attends Religious Services: More than 4 times per year    Active Member of [employer] or Organizations: Yes    Attends Engineer, structural: More than 4 times per year    Marital Status: Married  Catering manager Violence: Not on file     Current Outpatient Medications  Medication Instructions   azelastine  (ASTELIN ) 0.1 % nasal spray 2 sprays, Each Nare, At bedtime PRN, Use  in each nostril as directed   cholecalciferol (VITAMIN D3) 1,000 Units, Daily   CREATINE5000 PO 5 g, Daily   ELDERBERRY PO Take by mouth.   FLUoxetine  (PROZAC ) 20 mg, Oral, Daily   fluticasone  (FLONASE ) 50 MCG/ACT nasal spray 2 sprays, Each Nare, Daily   psyllium (REGULOID) 0.52 g, Daily       Objective:   Physical Exam BP 122/84   Pulse 67   Temp 98 F (36.7 C) (Oral)   Resp 16   Ht 5' 4 (1.626 m)   Wt 142 lb 4 oz (64.5 kg)   LMP 09/22/2023 (Exact Date)   SpO2 99%   BMI 24.42 kg/m  General: Well developed, NAD, BMI noted Neck: No  thyromegaly  HEENT:  Normocephalic . Face symmetric, atraumatic Lungs:  CTA B Normal respiratory effort, no intercostal retractions, no accessory muscle use. Heart: RRR,  no murmur.  Abdomen:  Not distended, soft, non-tender. No rebound or rigidity.   Lower extremities: no pretibial edema bilaterally  Skin:  Exposed areas without rash. Not pale. Not jaundice Neurologic:  alert & oriented X3.  Speech normal, gait appropriate for age and unassisted Strength symmetric and appropriate for age.  Psych: Cognition and judgment appear intact.  Cooperative with normal attention span and concentration.  Behavior appropriate. No anxious or depressed appearing.     Assessment     Assessment Hyperlipidemia CV: -Heart murmur -Aorta coarctation, surgery 1983 -Palpable aorta-- normal renal-aorta US  2009  Dysplastic nevus 2016, +FH melanoma-- sees derm q year Birth-control -- husband's  vasectomy  H/o Shingles 2011   PLAN Here for CPX --Td: 2023 -Vaccines are recommended: Flu shot every year.  COVID booster is a consideration --Female care per  gyn (Dr Mat), PAPs, MMGs -CCS: Again, 3 options discussed, elected  iFOB   --Labs: CMP FLP CBC TSH --Lifestyle: Still very active, have an appointment to see a nutritionist soon  Other issues Dyslipidemia: Last year LDL was unusually elevated.  Recheck today. CV: LOV w/ cards 10/2022, felt to be stable. CT angio chest aorta incidentally showed sclerotic appearance of the manubrium.  DDx is large but I suspect it is degenerative disease.  She feels well with no red flags, UTD on cancer screenings. This was explained to the patient, offered x-ray, declined. Skin cancer: Sees dermatology regularly Anxiety, panic attacks: Currently well-controlled RTC 1 year

## 2023-09-22 NOTE — Patient Instructions (Addendum)
 Vaccines I recommend: Flu shot every fall Consider COVID booster   Return the stool test at your convenience  GO TO THE LAB :  Get the blood work   Your results will be posted on MyChart with my comments  Next office visit for a physical exam in 1 year Please make an appointment before you leave today

## 2023-09-23 ENCOUNTER — Other Ambulatory Visit: Payer: Self-pay | Admitting: Internal Medicine

## 2023-09-23 ENCOUNTER — Encounter: Payer: Self-pay | Admitting: Internal Medicine

## 2023-09-23 NOTE — Assessment & Plan Note (Signed)
 Here for CPX --Td: 2023 -Vaccines are recommended: Flu shot every year.  COVID booster is a consideration --Female care per  gyn (Dr Mat), PAPs, MMGs -CCS: Again, 3 options discussed, elected  iFOB   --Labs: CMP FLP CBC TSH --Lifestyle: Still very active, have an appointment to see a nutritionist soon

## 2023-09-23 NOTE — Assessment & Plan Note (Signed)
 Here for CPX   Other issues Dyslipidemia: Last year LDL was unusually elevated.  Recheck today. CV: LOV w/ cards 10/2022, felt to be stable. CT angio chest aorta incidentally showed sclerotic appearance of the manubrium.  DDx is large but I suspect it is degenerative disease.  She feels well with no red flags, UTD on cancer screenings. This was explained to the patient, offered x-ray, declined. Skin cancer: Sees dermatology regularly Anxiety, panic attacks: Currently well-controlled RTC 1 year

## 2023-09-25 ENCOUNTER — Ambulatory Visit: Payer: Self-pay | Admitting: Internal Medicine

## 2024-02-17 ENCOUNTER — Other Ambulatory Visit: Payer: Self-pay | Admitting: Internal Medicine

## 2024-02-17 DIAGNOSIS — F419 Anxiety disorder, unspecified: Secondary | ICD-10-CM

## 2024-09-24 ENCOUNTER — Encounter: Admitting: Internal Medicine
